# Patient Record
Sex: Female | Born: 2000 | Race: Black or African American | Hispanic: No | Marital: Single | Smoking: Former smoker
Health system: Southern US, Community
[De-identification: ages and names within clinical notes are randomized; demographics above are authoritative.]

## PROBLEM LIST (undated history)

## (undated) DIAGNOSIS — O139 Gestational [pregnancy-induced] hypertension without significant proteinuria, unspecified trimester: Secondary | ICD-10-CM

## (undated) DIAGNOSIS — Z789 Other specified health status: Secondary | ICD-10-CM

## (undated) HISTORY — PX: NO PAST SURGERIES: SHX2092

## (undated) HISTORY — DX: Gestational (pregnancy-induced) hypertension without significant proteinuria, unspecified trimester: O13.9

## (undated) HISTORY — DX: Other specified health status: Z78.9

## (undated) HISTORY — PX: OTHER SURGICAL HISTORY: SHX169

---
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
## 2022-01-04 ENCOUNTER — Emergency Department (HOSPITAL_COMMUNITY)
Admission: EM | Admit: 2022-01-04 | Discharge: 2022-01-06 | Disposition: A | Discharge status: Home / Self Care | Payer: Medicaid Other | Attending: Emergency Medicine | Admitting: Emergency Medicine

## 2022-01-04 ENCOUNTER — Emergency Department (HOSPITAL_COMMUNITY): Payer: Medicaid Other

## 2022-01-04 ENCOUNTER — Encounter (HOSPITAL_COMMUNITY): Payer: Self-pay

## 2022-01-04 DIAGNOSIS — O9A22 Injury, poisoning and certain other consequences of external causes complicating childbirth: Secondary | ICD-10-CM | POA: Insufficient documentation

## 2022-01-04 DIAGNOSIS — T50902A Poisoning by unspecified drugs, medicaments and biological substances, intentional self-harm, initial encounter: Secondary | ICD-10-CM

## 2022-01-04 DIAGNOSIS — F332 Major depressive disorder, recurrent severe without psychotic features: Secondary | ICD-10-CM | POA: Insufficient documentation

## 2022-01-04 DIAGNOSIS — N9489 Other specified conditions associated with female genital organs and menstrual cycle: Secondary | ICD-10-CM | POA: Diagnosis not present

## 2022-01-04 DIAGNOSIS — O26891 Other specified pregnancy related conditions, first trimester: Secondary | ICD-10-CM | POA: Insufficient documentation

## 2022-01-04 DIAGNOSIS — Z008 Encounter for other general examination: Secondary | ICD-10-CM

## 2022-01-04 DIAGNOSIS — F4323 Adjustment disorder with mixed anxiety and depressed mood: Secondary | ICD-10-CM | POA: Diagnosis not present

## 2022-01-04 DIAGNOSIS — Z3A08 8 weeks gestation of pregnancy: Secondary | ICD-10-CM | POA: Insufficient documentation

## 2022-01-04 DIAGNOSIS — X838XXA Intentional self-harm by other specified means, initial encounter: Secondary | ICD-10-CM | POA: Diagnosis present

## 2022-01-04 DIAGNOSIS — T483X2A Poisoning by antitussives, intentional self-harm, initial encounter: Secondary | ICD-10-CM | POA: Insufficient documentation

## 2022-01-04 LAB — COMPREHENSIVE METABOLIC PANEL
ALT: 9 U/L (ref 0–44)
AST: 17 U/L (ref 15–41)
Albumin: 3.1 g/dL — ABNORMAL LOW (ref 3.5–5.0)
Alkaline Phosphatase: 58 U/L (ref 38–126)
Anion gap: 8 (ref 5–15)
BUN: 10 mg/dL (ref 6–20)
CO2: 19 mmol/L — ABNORMAL LOW (ref 22–32)
Calcium: 8.8 mg/dL — ABNORMAL LOW (ref 8.9–10.3)
Chloride: 107 mmol/L (ref 98–111)
Creatinine, Ser: 0.59 mg/dL (ref 0.44–1.00)
GFR, Estimated: >60 mL/min (ref >60)
Glucose, Bld: 77 mg/dL (ref 70–99)
Potassium: 3.4 mmol/L — ABNORMAL LOW (ref 3.5–5.1)
Sodium: 134 mmol/L — ABNORMAL LOW (ref 135–145)
Total Bilirubin: 0.5 mg/dL (ref 0.3–1.2)
Total Protein: 5.9 g/dL — ABNORMAL LOW (ref 6.5–8.1)

## 2022-01-04 LAB — URINALYSIS, ROUTINE W REFLEX MICROSCOPIC
Bilirubin Urine: NEGATIVE
Glucose, UA: NEGATIVE mg/dL
Hgb urine dipstick: NEGATIVE
Ketones, ur: 20 mg/dL — AB
Leukocytes,Ua: NEGATIVE
Nitrite: NEGATIVE
Protein, ur: NEGATIVE mg/dL
Specific Gravity, Urine: 1.021 (ref 1.005–1.030)
pH: 6 (ref 5.0–8.0)

## 2022-01-04 LAB — I-STAT VENOUS BLOOD GAS, ED
Acid-base deficit: 4 mmol/L — ABNORMAL HIGH (ref 0.0–2.0)
Bicarbonate: 19.9 mmol/L — ABNORMAL LOW (ref 20.0–28.0)
Calcium, Ion: 1.18 mmol/L (ref 1.15–1.40)
HCT: 32 % — ABNORMAL LOW (ref 36.0–46.0)
Hemoglobin: 10.9 g/dL — ABNORMAL LOW (ref 12.0–15.0)
O2 Saturation: 91 %
Potassium: 3.4 mmol/L — ABNORMAL LOW (ref 3.5–5.1)
Sodium: 135 mmol/L (ref 135–145)
TCO2: 21 mmol/L — ABNORMAL LOW (ref 22–32)
pCO2, Ven: 33.3 mmHg — ABNORMAL LOW (ref 44–60)
pH, Ven: 7.384 (ref 7.25–7.43)
pO2, Ven: 62 mmHg — ABNORMAL HIGH (ref 32–45)

## 2022-01-04 LAB — ACETAMINOPHEN LEVEL
Acetaminophen (Tylenol), Serum: 31 ug/mL — ABNORMAL HIGH (ref 10–30)
Acetaminophen (Tylenol), Serum: 18 ug/mL (ref 10–30)

## 2022-01-04 LAB — CBC WITH DIFFERENTIAL/PLATELET
Abs Immature Granulocytes: 0.02 10*3/uL (ref 0.00–0.07)
Basophils Absolute: 0 10*3/uL (ref 0.0–0.1)
Basophils Relative: 0 %
Eosinophils Absolute: 0.2 10*3/uL (ref 0.0–0.5)
Eosinophils Relative: 2 %
HCT: 31.1 % — ABNORMAL LOW (ref 36.0–46.0)
Hemoglobin: 9.9 g/dL — ABNORMAL LOW (ref 12.0–15.0)
Immature Granulocytes: 0 %
Lymphocytes Relative: 24 %
Lymphs Abs: 2.4 10*3/uL (ref 0.7–4.0)
MCH: 21.9 pg — ABNORMAL LOW (ref 26.0–34.0)
MCHC: 31.8 g/dL (ref 30.0–36.0)
MCV: 68.8 fL — ABNORMAL LOW (ref 80.0–100.0)
Monocytes Absolute: 0.9 10*3/uL (ref 0.1–1.0)
Monocytes Relative: 9 %
Neutro Abs: 6.4 10*3/uL (ref 1.7–7.7)
Neutrophils Relative %: 65 %
Platelets: 208 10*3/uL (ref 150–400)
RBC: 4.52 MIL/uL (ref 3.87–5.11)
RDW: 16.7 % — ABNORMAL HIGH (ref 11.5–15.5)
WBC: 10 10*3/uL (ref 4.0–10.5)
nRBC: 0 % (ref 0.0–0.2)

## 2022-01-04 LAB — ETHANOL: Alcohol, Ethyl (B): <10 mg/dL (ref <10)

## 2022-01-04 LAB — HCG, QUANTITATIVE, PREGNANCY: hCG, Beta Chain, Quant, S: 177226 m[IU]/mL — ABNORMAL HIGH (ref <5)

## 2022-01-04 LAB — RAPID URINE DRUG SCREEN, HOSP PERFORMED
Amphetamines: NOT DETECTED
Barbiturates: NOT DETECTED
Benzodiazepines: NOT DETECTED
Cocaine: NOT DETECTED
Opiates: NOT DETECTED
Tetrahydrocannabinol: POSITIVE — AB

## 2022-01-04 LAB — SALICYLATE LEVEL: Salicylate Lvl: <7 mg/dL — ABNORMAL LOW (ref 7.0–30.0)

## 2022-01-04 LAB — OSMOLALITY: Osmolality: 273 mOsm/kg — ABNORMAL LOW (ref 275–295)

## 2022-01-04 MED ORDER — ONDANSETRON 4 MG PO TBDP
4.0000 mg | ORAL_TABLET | Freq: Once | ORAL | Status: AC
Start: 2022-01-04 — End: 2022-01-04
  Administered 2022-01-04: 4 mg via ORAL
  Filled 2022-01-04: qty 1

## 2022-01-04 NOTE — ED Triage Notes (Signed)
Pt is 2 months pregnant. Reports increasing depression. Took 80% of Robitussin 4oz bottle to OD. Reports abdominal pain and nausea. Alert and oriented x 4.  ?

## 2022-01-04 NOTE — ED Provider Notes (Signed)
?[name] EMERGENCY DEPARTMENT ?Provider Note ? ? ?CSN: [id number] ?Arrival date & time: 01/04/22  1054 ? ?  ? ?History ? ?Chief Complaint  ?Patient presents with  ? Drug Overdose  ? ? ?Jean Roberts is a 21 y.o. female. ? ?21 year old female presents with her mom for evaluation of potential overdose.  Patient is [redacted] weeks pregnant.  Patient states she drank most of the Robitussin bottle around 9 AM.  She is accompanied by mom who states patient has been suicidal and has been threatening to commit suicide for the past couple years.  Today was her first attempt.  Patient unable to quantify exactly what led her to come at today's episode.  She she states everything around without from going on let her to today suicide attempt.  She denies HI, or AVH.  Denies other substance abuse.  Does take prenatal vitamins otherwise denies any other daily medications. ? ?The history is provided by the patient. No language interpreter was used.  ? ?  ? ?Home Medications ?Prior to Admission medications   ?Medication Sig Start Date End Date Taking? Authorizing Provider  ?acetaminophen (TYLENOL) 500 MG tablet Take 2 tablets (1,000 mg total) by mouth every 6 (six) hours as needed for mild pain, moderate pain, fever or headache (for pain scale < 4). 04/21/21   Katrinka Blazing, [place], CNM  ?Prenatal Vit-Fe Fumarate-FA (PREPLUS) 27-1 MG TABS Take 1 tablet by mouth daily. 10/01/20   Hermina Staggers, MD  ?   ? ?Allergies    ?Patient has no known allergies.   ? ?Review of Systems   ?Review of Systems  ?Respiratory:  Negative for shortness of breath.   ?Gastrointestinal:  Positive for abdominal pain and nausea. Negative for vomiting.  ?Genitourinary:  Negative for vaginal pain.  ?Psychiatric/Behavioral:  Positive for self-injury and suicidal ideas. Negative for agitation.   ?All other systems reviewed and are negative. ? ?Physical Exam ?Updated Vital Signs ?BP 112/62 (BP Location: Left Arm)   Pulse (!) 59   Temp 98.1 ?F (36.7 ?C)  (Oral)   Resp 18   Ht 5\' 4"  (1.626 m)   Wt 53.5 kg   LMP  (LMP Unknown)   SpO2 100%   BMI 20.25 kg/m?  ?Physical Exam ?Vitals and nursing note reviewed.  ?Constitutional:   ?   General: She is not in acute distress. ?   Appearance: Normal appearance. She is not ill-appearing.  ?HENT:  ?   Head: Normocephalic and atraumatic.  ?   Nose: Nose normal.  ?Eyes:  ?   General: No scleral icterus. ?   Extraocular Movements: Extraocular movements intact.  ?   Conjunctiva/sclera: Conjunctivae normal.  ?Cardiovascular:  ?   Rate and Rhythm: Normal rate and regular rhythm.  ?   Pulses: Normal pulses.  ?Pulmonary:  ?   Effort: Pulmonary effort is normal. No respiratory distress.  ?   Breath sounds: Normal breath sounds. No wheezing or rales.  ?Abdominal:  ?   General: There is no distension.  ?   Tenderness: There is no abdominal tenderness. There is no right CVA tenderness, left CVA tenderness or guarding.  ?Musculoskeletal:     ?   General: Normal range of motion.  ?   Cervical back: Normal range of motion.  ?Skin: ?   General: Skin is warm and dry.  ?Neurological:  ?   General: No focal deficit present.  ?   Mental Status: She is alert. Mental status is at baseline.  ? ? ?  ED Results / Procedures / Treatments   ?Labs ?(all labs ordered are listed, but only abnormal results are displayed) ?Labs Reviewed  ?I-STAT VENOUS BLOOD GAS, ED - Abnormal; Notable for the following components:  ?    Result Value  ? pCO2, Ven 33.3 (*)   ? pO2, Ven 62 (*)   ? Bicarbonate 19.9 (*)   ? TCO2 21 (*)   ? Acid-base deficit 4.0 (*)   ? Potassium 3.4 (*)   ? HCT 32.0 (*)   ? Hemoglobin 10.9 (*)   ? All other components within normal limits  ?CBC WITH DIFFERENTIAL/PLATELET  ?COMPREHENSIVE METABOLIC PANEL  ?URINALYSIS, ROUTINE W REFLEX MICROSCOPIC  ?RAPID URINE DRUG SCREEN, HOSP PERFORMED  ?SALICYLATE LEVEL  ?ACETAMINOPHEN LEVEL  ?ETHANOL  ?BLOOD GAS, VENOUS  ?OSMOLALITY  ?ACETAMINOPHEN LEVEL  ? ? ?EKG ?None ? ?Radiology ?No results  found. ? ?Procedures ?Procedures  ? ? ?Medications Ordered in ED ?Medications  ?ondansetron (ZOFRAN-ODT) disintegrating tablet 4 mg (4 mg Oral Given 01/04/22 1146)  ? ? ?ED Course/ Medical Decision Making/ A&P ?  ?                        ?Medical Decision Making ?Amount and/or Complexity of Data Reviewed ?Labs: ordered. ? ?Risk ?Prescription drug management. ? ? ?21 year old female presents to the emergency room for evaluation of suicidal ideation, suicide attempt.  She drank two thirds of the Robitussin maximum strength bottle.  Currently complains of abdominal pain and nausea.  Denies vaginal bleeding, chest pain, or other complaints.  Discussed with poison control who recommended Tylenol level at 4-hour mark and a CMP.  If those were without concern patient is cleared from their standpoint.  Will evaluate with additional psych labs and place TTS consult.  Discussed with MAU APP who following the Tylenol level will have patient transition to MAU for further work-up for ectopic.  She has placed orders for ultrasound and beta-hCG. ?IVC paperwork completed.  Tylenol level at 4-hour mark is 31.  Which is less than 150 raising concern for acute toxicity.  Patient's hemoglobin is 9.9 which is around patient's baseline.  CMP shows potassium 3.4 otherwise without acute concerns.  Will be ultrasound shows intrauterine fetus otherwise without acute concerns.  Will discuss with MAU. ? ?MAU states her ultrasound is stable and she is cleared from OB standpoint.  Patient at this point awaiting TTS consult.  She has been signed out to default provider.  Mom made aware of plan.  She voices understanding. ? ?Final Clinical Impression(s) / ED Diagnoses ?Final diagnoses:  ?Intentional drug overdose, initial encounter [facility])  ?Patient needs psychiatric hold for evaluation  ? ? ?Rx / DC Orders ?ED Discharge Orders   ? ? None  ? ?  ? ? ?  ?Marita [place], PA-C ?01/04/22 1449 ? ?  ?Sloan Leiter, DO ?01/05/22 1919 ? ?  ?Sloan Leiter,  DO ?01/05/22 1922 ? ?
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
## 2022-01-04 NOTE — ED Notes (Signed)
MH eval began with monitor. ?

## 2022-01-04 NOTE — ED Notes (Signed)
Report received assumed care, pt. Was brought from hallway 21 on stretcher  with sitter at this time. Pt. Dressed in Systems analyst. Pt. Slightly drowsy and feels dizzy. Security will wand patient. ?

## 2022-01-04 NOTE — BH Assessment (Signed)
@   12:35 Labs pending - patient is not medically cleared at this time.  ?

## 2022-01-05 NOTE — BH Assessment (Signed)
RE-ASSESSMENT ? ?Pt is an 21 year old single female who ingested a bottle of Robitussin in suicide attempt yesterday. Inpatient psychiatric treatment was recommended and Pt was open to recommendation. Today, Pt says she is feeling better and would like to be discharged home, stating there are people in the ED who need the bed more than her. She says yesterday she "had a little mental breakdown" felt overwhelmed and was crying. She says she felt no one was listening to her and that she was a bad mother because she had lost her [facility] card and her baby was out of formula. She says this was her first suicide attempt. She denies current suicidal ideation, homicidal ideation, or psychotic symptoms. She says she acknowledges that her children's father does not want a relationship and is does not want to be with someone who does not want to be with her. She says she does not have an outpatient mental health provider but she would like to participate in counseling. ? ?Pt says she lives with her mother and her siblings. She gave permission to speak with her mother, Inda Merlin [phone]. Ms Joelene Millin says she does not feel Pt would harm herself if she came home but she would like Pt to receive treatment as soon as possible. She says she is comfortable with discharge or with inpatient psychiatric treatment. ? ?Pt was petitioned for involuntary commitment by Tanda Rockers, DO on 01/04/2022. ? ?Discussed case with Roselyn Bering, NP who recommends inpatient psychiatric treatment. Notified Dr. Coralee Pesa and Valetta Close, RN of recommendation via secure message. ? ? ?Harlin Rain Patsy [place], [facility], NCC ?Triage Specialist ?[phone] ? ?

## 2022-01-05 NOTE — ED Notes (Signed)
Breakfast Orders Placed °

## 2022-01-05 NOTE — ED Notes (Signed)
IVC paperwork found in patients chart by Dr. Wallace Cullens on 4/29. IVC order placed and paperwork faxed to [facility].  ?

## 2022-01-05 NOTE — ED Notes (Signed)
Pt requesting to speak with TTS team again. Spoke with TTS and they states she is on the list and will be contacted later in the night. Pt informed.  ?

## 2022-01-05 NOTE — Progress Notes (Signed)
Per Tilford Pillar, patient meets criteria for inpatient treatment. There are no available at [facility] today, per [facility]. CSW faxed referrals to the following facilities for review: ? ?[facility]  Pending - Request Sent N/A [address], [place] [address] [phone] --  ?[facility]  Pending - Request Sent N/A [address], [place] [address] [phone] --  ?[facility]  Pending - Request Sent N/A [address] [place] [address] [phone] --  ?[facility]  Pending - Request Sent N/A [address] [place], [place] [address] [phone] --  ?[facility]  Pending - Request Sent N/A [address], [place] [address] [phone] --  ? ?TTS will continue to seek bed placement. ? ?Crissie Reese, MSW, LCSW-A, LCAS-A ?Phone: [phone] ?Disposition/TOC ? ?

## 2022-01-05 NOTE — ED Notes (Signed)
Pt. Had dinner, pt is calm and cooperative  ?

## 2022-01-05 NOTE — ED Notes (Signed)
Pt sleeping at this time. Will obtain vitals when pt wakes up.  

## 2022-01-05 NOTE — ED Provider Notes (Signed)
Emergency Medicine Observation Re-evaluation Note ? ?Jean Roberts is a 21 y.o. female, seen on rounds today.  Pt initially presented to the ED for complaints of Drug Overdose ?Currently, the patient is sitting in room. ? ?Physical Exam  ?BP (!) 113/47 (BP Location: Right Arm)   Pulse 66   Temp 98.3 ?F (36.8 ?C) (Oral)   Resp 18   Ht 5\' 4"  (1.626 m)   Wt 53.5 kg   LMP  (LMP Unknown)   SpO2 100%   BMI 20.25 kg/m?  ?Physical Exam ?General: resting comfortably, NAD ?Lungs: normal WOB ?Psych: currently calm and resting ? ?ED Course / MDM  ?EKG:  ? ?I have reviewed the labs performed to date as well as medications administered while in observation.  Recent changes in the last 24 hours include none . ? ?Plan  ?Current plan is for inpatient psychiatric care, pending placement. ? Jean Roberts is not under involuntary commitment. ? ? ?  ?[name], DO ?01/05/22 1130 ? ?

## 2022-01-05 NOTE — ED Notes (Addendum)
Pt requested to speak to psych again, after speaking, Quintella Reichert, NP continues to recommend inpatient psychiatric treatment. ?

## 2022-01-06 ENCOUNTER — Telehealth: Payer: Self-pay | Admitting: Family Medicine

## 2022-01-06 ENCOUNTER — Encounter (HOSPITAL_COMMUNITY): Payer: Self-pay | Admitting: Registered Nurse

## 2022-01-06 DIAGNOSIS — Z008 Encounter for other general examination: Secondary | ICD-10-CM

## 2022-01-06 DIAGNOSIS — T50902A Poisoning by unspecified drugs, medicaments and biological substances, intentional self-harm, initial encounter: Secondary | ICD-10-CM

## 2022-01-06 DIAGNOSIS — F4323 Adjustment disorder with mixed anxiety and depressed mood: Secondary | ICD-10-CM

## 2022-01-06 DIAGNOSIS — X838XXA Intentional self-harm by other specified means, initial encounter: Secondary | ICD-10-CM | POA: Diagnosis present

## 2022-01-06 NOTE — ED Notes (Signed)
Breakfast Order Placed ?

## 2022-01-06 NOTE — ED Notes (Signed)
Pt is resting to left side with eyes closed. NAD noted, equal and symmetrical RR noted. Care plan continued.  ?

## 2022-01-06 NOTE — ED Notes (Signed)
Pt is resting to left side with eyes closed. NAD noted, equal and symmetrical RR noted. Care plan continued.  ?

## 2022-01-06 NOTE — BH Assessment (Addendum)
[facility] Assessment Progress Note ? ?Per Shuvon Rankin, NP, this pt does not require psychiatric hospitalization at this time.  Pt is psychiatrically cleared.  Shuvon has recommended pt for the Partial Hospitalization Program, and Moses Manners, LCSW-A has scheduled pt for a virtual intake appointment  tomorrow, Tuesday, Jan 07, 2022 at 12:30, which this Probation officer has included in pt's discharge instructions.  Pt's nurses, Marta Antu and [facility], have been notified. ? ?Jean Mullet, MA ?Triage Specialist ?[phone] ? ?

## 2022-01-06 NOTE — ED Notes (Signed)
Pt sleeping at this time. Will obtain vitals when she wakes up.  ?

## 2022-01-06 NOTE — Discharge Instructions (Addendum)
For your behavioral health needs you are advised to follow up with the Partial Hospitalization Program (PHP) offered through [facility].  You are scheduled for an assessment for the PHP on Tuesday, Jan 07, 2022 at 12:30 pm. This appointment will last approximately one hour and will be virtual via Webex. PHP is virtual group therapy that runs Monday - Friday from 9:00 am-1:00 pm. Please download the [employer] app prior to the appointment. If you need to cancel or reschedule, please call [phone]. ?     [facility] ?     [address] ?     [place], [place] [address] ?     Contact: Ardith Dark, LCSW-A ?     [phone]  ? ? ?Safety Plan ?Jean Roberts will reach out to her mother, grandmother, call 911 or call mobile crisis, or go to nearest emergency room if condition worsens or if suicidal thoughts become active ?Patients' will follow up with resources/referral for outpatient psychiatric services (therapy/medication management).  PHP or IOP program ?The suicide prevention education provided includes the following: ?Suicide risk factors ?Suicide prevention and interventions ?National Suicide Hotline telephone number ?[facility] assessment telephone number ?[facility] Emergency Assistance 911 ?[place] and/or Residential Mobile Crisis Unit telephone number ?Request made of family/significant other to: Patient and her mother ?Remove weapons (e.g., guns, rifles, knives), all items previously/currently identified as safety concern.   ?Remove drugs/medications (over the counter, prescriptions, illicit drugs), all items previously/currently identified as a safety concern.   ?

## 2022-01-06 NOTE — Telephone Encounter (Signed)
Called patient to schedule new ob appointments, there was no answer to the phone call and the option to leave a voicemail was unavailable. A mychart message was sent to the patient.  ?

## 2022-01-06 NOTE — Consult Note (Signed)
Telepsych Consultation  ? ?Reason for Consult:  Suicide attempt ?Referring Physician:  Marita Kansas, PA-C ?Location of Patient: [facility] ED ?Location of Provider: Other: [facility] ? ?Patient Identification: Jean Roberts ?MRN:  [id number] ?Principal Diagnosis: Adjustment disorder with mixed anxiety and depressed mood ?Diagnosis:  Principal Problem: ?  Adjustment disorder with mixed anxiety and depressed mood ?Active Problems: ?  Unsuccessful suicide attempt [facility]) ? ? ?Total Time spent with patient: 45 minutes ? ?Subjective:   ?Jean Roberts is a 21 y.o. female patient admitted to [facility] ED after presenting voluntarily with complaints of potentially overdosing on Robitussin. ? ?HPI:  Jean Roberts, 21 y.o., female patient seen via tele health by this provider, consulted with Dr. Nelly Rout; and chart reviewed on 01/06/22.  On evaluation Jean Roberts reports she was brought to the hospital because she tried to kill herself.  Patient reports that she was feeling overwhelmed related to having a lot going on at that time.  She reports she feels a lot better today and that she is no longer having any suicidal thoughts and regrets the actions she took.  At this time patient denies suicidal/self-harm/homicidal ideation, psychosis, and paranoia.  Patient reports she has been eating and sleeping without any difficulty.  Patient denies prior psychiatric history.  Reports she lives at home with her mother and 2 younger sisters.  Reports her family is supportive and they are getting along.  Patient reports she has plans to graduate high school this year and to attend college after graduation.  Patient gave permission to speak to her mother for collateral information. ?During evaluation Jean Roberts is sitting up on bed with no noted distress.  She is alert/oriented x 4; calm/cooperative; and mood congruent with affect.  She is speaking in a clear tone at moderate volume, and normal pace; with good eye contact.  Her thought process is  coherent and relevant; There is no indication that she is currently responding to internal/external stimuli or experiencing delusional thought content; and she denies suicidal/self-harm/homicidal ideation, psychosis, and paranoia.  Patient has remained calm throughout assessment and has answered questions appropriately.   ? ? ?Collateral Information:  Spoke to patients mother Inda Merlin at [phone].  Mother reports that she has spoken to patient and feels that patient will be safe if discharged home.  States that she has no more concerns for her safety but would like for to be set up in a outpatient program (services).  States there is some depression and wants patient and baby to have services needed to continue being safe.  States patient is to graduate from high school June 9th.  States that patient will be able to do in person or virtual services.  ? ?Past Psychiatric History: Reports feelings of depression, no prior diagnosis.  No prior psychiatric hospitalization, or outpatient psychiatric services.  Denies psychotropic medications, prior suicide attempt, or self harming behaviors.   ? ?Risk to Self:  Denies ?Risk to Others:  Denies ?Prior Inpatient Therapy:  No ?Prior Outpatient Therapy:  No ? ?Past Medical History:  ?Past Medical History:  ?Diagnosis Date  ? Medical history non-contributory   ?  ?Past Surgical History:  ?Procedure Laterality Date  ? NO PAST SURGERIES    ? ?Family History:  ?Family History  ?Problem Relation Age of Onset  ? Healthy Mother   ? Healthy Father   ? ?Family Psychiatric  History: None reported ?Social History:  ?Social History  ? ?Substance and Sexual Activity  ?Alcohol Use Never  ?   ?  Social History  ? ?Substance and Sexual Activity  ?Drug Use Never  ?  ?Social History  ? ?Socioeconomic History  ? Marital status: Single  ?  Spouse name: Not on file  ? Number of children: Not on file  ? Years of education: Not on file  ? Highest education level: Not on file  ?Occupational  History  ? Not on file  ?Tobacco Use  ? Smoking status: Every Day  ? Smokeless tobacco: Current  ?Vaping Use  ? Vaping Use: Every day  ?Substance and Sexual Activity  ? Alcohol use: Never  ? Drug use: Never  ? Sexual activity: Yes  ?  Birth control/protection: Pill  ?Other Topics Concern  ? Not on file  ?Social History Narrative  ? Not on file  ? ?Social Determinants of Health  ? ?Financial Resource Strain: Not on file  ?Food Insecurity: No Food Insecurity  ? Worried About Programme researcher, broadcasting/film/videounning Out of Food in the Last Year: Never true  ? Ran Out of Food in the Last Year: Never true  ?Transportation Needs: Unmet Transportation Needs  ? Lack of Transportation (Medical): Yes  ? Lack of Transportation (Non-Medical): Yes  ?Physical Activity: Not on file  ?Stress: Not on file  ?Social Connections: Not on file  ? ?Additional Social History: ?  ? ?Allergies:  No Known Allergies ? ?Labs:  ?Results for orders placed or performed during the hospital encounter of 01/04/22 (from the past 48 hour(s))  ?Salicylate level     Status: Abnormal  ? Collection Time: 01/04/22 11:49 AM  ?Result Value Ref Range  ? Salicylate Lvl <7.0 (L) 7.0 - 30.0 mg/dL  ?  Comment: Performed at [facility], [address] [place], [place] [address]  ?Acetaminophen level     Status: Abnormal  ? Collection Time: 01/04/22 11:49 AM  ?Result Value Ref Range  ? Acetaminophen (Tylenol), Serum 31 (H) 10 - 30 ug/mL  ?  Comment: (NOTE) ?Therapeutic concentrations vary significantly. A range of 10-30 ug/mL  ?may be an effective concentration for many patients. However, some  ?are best treated at concentrations outside of this range. ?Acetaminophen concentrations >150 ug/mL at 4 hours after ingestion  ?and >50 ug/mL at 12 hours after ingestion are often associated with  ?toxic reactions. ? ?Performed at [facility], [address] [place], [place] [address] ?  ?Ethanol     Status: None  ? Collection Time: 01/04/22 11:49 AM  ?Result Value Ref Range  ? Alcohol,  Ethyl (B) <10 <10 mg/dL  ?  Comment: (NOTE) ?Lowest detectable limit for serum alcohol is 10 mg/dL. ? ?For medical purposes only. ?Performed at [facility], [address] [place], [place] ?[address] ?  ?CBC with Differential     Status: Abnormal  ? Collection Time: 01/04/22 11:50 AM  ?Result Value Ref Range  ? WBC 10.0 4.0 - 10.5 K/uL  ? RBC 4.52 3.87 - 5.11 MIL/uL  ? Hemoglobin 9.9 (L) 12.0 - 15.0 g/dL  ? HCT 31.1 (L) 36.0 - 46.0 %  ? MCV 68.8 (L) 80.0 - 100.0 fL  ? MCH 21.9 (L) 26.0 - 34.0 pg  ? MCHC 31.8 30.0 - 36.0 g/dL  ? RDW 16.7 (H) 11.5 - 15.5 %  ? Platelets 208 150 - 400 K/uL  ?  Comment: REPEATED TO VERIFY  ? nRBC 0.0 0.0 - 0.2 %  ? Neutrophils Relative % 65 %  ? Neutro Abs 6.4 1.7 - 7.7 K/uL  ? Lymphocytes  Relative 24 %  ? Lymphs Abs 2.4 0.7 - 4.0 K/uL  ? Monocytes Relative 9 %  ? Monocytes Absolute 0.9 0.1 - 1.0 K/uL  ? Eosinophils Relative 2 %  ? Eosinophils Absolute 0.2 0.0 - 0.5 K/uL  ? Basophils Relative 0 %  ? Basophils Absolute 0.0 0.0 - 0.1 K/uL  ? Immature Granulocytes 0 %  ? Abs Immature Granulocytes 0.02 0.00 - 0.07 K/uL  ?  Comment: Performed at [facility], [address] [place], [place] [address]  ?Comprehensive metabolic panel     Status: Abnormal  ? Collection Time: 01/04/22 11:50 AM  ?Result Value Ref Range  ? Sodium 134 (L) 135 - 145 mmol/L  ? Potassium 3.4 (L) 3.5 - 5.1 mmol/L  ? Chloride 107 98 - 111 mmol/L  ? CO2 19 (L) 22 - 32 mmol/L  ? Glucose, Bld 77 70 - 99 mg/dL  ?  Comment: Glucose reference range applies only to samples taken after fasting for at least 8 hours.  ? BUN 10 6 - 20 mg/dL  ? Creatinine, Ser 0.59 0.44 - 1.00 mg/dL  ? Calcium 8.8 (L) 8.9 - 10.3 mg/dL  ? Total Protein 5.9 (L) 6.5 - 8.1 g/dL  ? Albumin 3.1 (L) 3.5 - 5.0 g/dL  ? AST 17 15 - 41 U/L  ? ALT 9 0 - 44 U/L  ? Alkaline Phosphatase 58 38 - 126 U/L  ? Total Bilirubin 0.5 0.3 - 1.2 mg/dL  ? GFR, Estimated >60 >60 mL/min  ?  Comment: (NOTE) ?Calculated using the CKD-EPI Creatinine Equation  (2021) ?  ? Anion gap 8 5 - 15  ?  Comment: Performed at [facility], [address] [place], [place] [address]  ?Osmolality     Status: Abnormal  ? Collection Time: 01/04/22 11:50 AM  ?Result Value Ref Range

## 2022-01-07 ENCOUNTER — Ambulatory Visit (HOSPITAL_COMMUNITY): Payer: Medicaid Other

## 2022-01-07 ENCOUNTER — Telehealth (HOSPITAL_COMMUNITY): Payer: Self-pay | Admitting: Licensed Clinical Social Worker

## 2022-01-07 ENCOUNTER — Telehealth: Payer: Self-pay | Admitting: Family Medicine

## 2022-01-07 NOTE — Telephone Encounter (Signed)
Cln began Webex meeting at 12:30 and remained connected until 12:45 pm. Pt failed to sign on. ?

## 2022-01-07 NOTE — Telephone Encounter (Signed)
Called patient to schedule New OB appointments, there was no answer to the phone call and the option to leave a voicemail was unavailable.  ?

## 2022-01-21 ENCOUNTER — Encounter: Payer: Self-pay | Admitting: Obstetrics & Gynecology

## 2022-01-22 ENCOUNTER — Telehealth (INDEPENDENT_AMBULATORY_CARE_PROVIDER_SITE_OTHER): Payer: Medicaid Other

## 2022-01-22 DIAGNOSIS — O09299 Supervision of pregnancy with other poor reproductive or obstetric history, unspecified trimester: Secondary | ICD-10-CM

## 2022-01-22 DIAGNOSIS — Z3A00 Weeks of gestation of pregnancy not specified: Secondary | ICD-10-CM

## 2022-01-22 DIAGNOSIS — O0990 Supervision of high risk pregnancy, unspecified, unspecified trimester: Secondary | ICD-10-CM | POA: Insufficient documentation

## 2022-01-22 HISTORY — DX: Supervision of high risk pregnancy, unspecified, unspecified trimester: O09.90

## 2022-01-22 MED ORDER — GOJJI WEIGHT SCALE MISC: 1.0000 | 0 refills | Status: DC | PRN

## 2022-01-22 NOTE — Progress Notes (Signed)
New OB Intake  I connected with  Jean Roberts on 01/22/22 at  9:15 AM EDT by MyChart Video Visit and verified that I am speaking with the correct person using two identifiers. Nurse is located at [facility] and pt is located at home.  I discussed the limitations, risks, security and privacy concerns of performing an evaluation and management service by telephone and the availability of in person appointments. I also discussed with the patient that there may be a patient responsible charge related to this service. The patient expressed understanding and agreed to proceed.  I explained I am completing New OB Intake today. We discussed her EDD of 08/11/2022 that is based on ultrasound on 12/10/2021 Pt is G2/P1. I reviewed her allergies, medications, Medical/Surgical/OB history, and appropriate screenings. I informed her of [facility] services. Based on history, this is an  pregnancy is complicated by history of gestational hypertension:  Patient Active Problem List   Diagnosis Date Noted   Supervision of high risk pregnancy, antepartum 01/22/2022   Adjustment disorder with mixed anxiety and depressed mood 01/06/2022   Unsuccessful suicide attempt (HCC) 01/06/2022   Intentional drug overdose (HCC)    Patient needs psychiatric hold for evaluation    Gestational hypertension 04/18/2021    Concerns addressed today: -Patient is not required to get a pap smear unitl age 76. -Anatomy ultrasound scheduled.  -History of gestational hypertension.   Delivery Plans:  Plans [facility].   MyChart/Babyscripts MyChart access verified. I explained pt will have some visits in office and some virtually. Babyscripts instructions given and order placed. Patient verifies receipt of registration text/e-mail. Account successfully created and app downloaded.  Blood Pressure Cuff  Patient still has her blood pressure cuff from her last pregnancy.  Weight scale: Patient does not  have weight scale. Weight  scale ordered for patient to pick up from [employer].   Anatomy US Explained first scheduled [place] will be around 19 weeks. Anatomy US scheduled for 03/17/2022 at 10:30AM. Pt notified to arrive at 10:15AM.  Labs Discussed Natera genetic screening with patient. Would like both Panorama and Horizon drawn at new OB visit.Also if interested in genetic testing, tell patient she will need AFP 15-21 weeks to complete genetic testing .Routine prenatal labs needed.  Covid Vaccine Patient has not covid vaccine.   Is patient a CenteringPregnancy candidate?  Declined Declined due to Childcare  Is patient a Mom+Baby Combined Care candidate?  Not a candidate    Is patient interested in [place]?  No  Informed patient of [facility] website  and placed link in her AVS.   Social Determinants of Health Food Insecurity: Patient denies food insecurity. WIC Referral: Patient is interested in referral to [facility].  Transportation: Patient denies transportation needs. Childcare: Discussed no children allowed at ultrasound appointments. Offered childcare services; patient declines childcare services at this time.  Send link to Pregnancy Navigators   Placed OB Box on problem list and updated  First visit review I reviewed new OB appt with pt. I explained she will have a pelvic exam, ob bloodwork with genetic screening. Explained pt will be seen by Scheryl Darter MD at first visit; encounter routed to appropriate provider. Explained that patient will be seen by pregnancy navigator following visit with provider. [facility] information placed in AVS.   Vidal Schwalbe, CMA 01/22/2022  9:36 AM

## 2022-02-24 ENCOUNTER — Other Ambulatory Visit (HOSPITAL_COMMUNITY)
Admission: RE | Admit: 2022-02-24 | Discharge: 2022-02-24 | Disposition: A | Discharge status: Home / Self Care | Payer: Medicaid Other | Source: Ambulatory Visit | Attending: Obstetrics & Gynecology | Admitting: Obstetrics & Gynecology

## 2022-02-24 ENCOUNTER — Encounter: Payer: Self-pay | Admitting: Obstetrics & Gynecology

## 2022-02-24 ENCOUNTER — Ambulatory Visit (INDEPENDENT_AMBULATORY_CARE_PROVIDER_SITE_OTHER): Payer: Medicaid Other | Admitting: Obstetrics & Gynecology

## 2022-02-24 VITALS — BP 92/66 | HR 70 | Wt 124.6 lb

## 2022-02-24 DIAGNOSIS — O0990 Supervision of high risk pregnancy, unspecified, unspecified trimester: Secondary | ICD-10-CM | POA: Diagnosis present

## 2022-02-24 DIAGNOSIS — F4323 Adjustment disorder with mixed anxiety and depressed mood: Secondary | ICD-10-CM | POA: Diagnosis not present

## 2022-02-24 DIAGNOSIS — Z3A16 16 weeks gestation of pregnancy: Secondary | ICD-10-CM | POA: Diagnosis not present

## 2022-02-24 MED ORDER — ASPIRIN 81 MG PO TBEC: 81.0000 mg | DELAYED_RELEASE_TABLET | Freq: Every day | ORAL | 2 refills | Status: DC

## 2022-02-24 MED ORDER — BLOOD PRESSURE KIT DEVI: 1.0000 | 0 refills | Status: DC | PRN

## 2022-02-24 NOTE — Progress Notes (Signed)
  Subjective:    Jean Roberts is a G2P1001 [redacted]w[redacted]d being seen today for her first obstetrical visit.  Her obstetrical history is significant for  recent delivery, teen pregnancy, h/o GHTN . Patient does intend to breast feed. Pregnancy history fully reviewed.  Patient reports no complaints.  Vitals:   02/24/22 0828  BP: 92/66  Pulse: 70  Weight: 124 lb 9.6 oz (56.5 kg)    HISTORY: OB History  Gravida Para Term Preterm AB Living  2 1 1     1   SAB IAB Ectopic Multiple Live Births        0 1    # Outcome Date GA Lbr Len/2nd Weight Sex Delivery Anes PTL Lv  2 Current           1 Term 04/19/21 [redacted]w[redacted]d 01:50 / 00:08 5 lb 14.9 oz (2.69 kg) M Vag-Spont EPI  LIV   Past Medical History:  Diagnosis Date   Gestational hypertension    Medical history non-contributory    Past Surgical History:  Procedure Laterality Date   gestational hypertension     NO PAST SURGERIES     Family History  Problem Relation Age of Onset   Healthy Mother    Healthy Father      Exam    Uterus:   16 weeks  Pelvic Exam:    Perineum:    Vulva:    Vagina:     pH:    Cervix:    Adnexa: not evaluated   Bony Pelvis:   System: Breast:  deferred   Skin: normal coloration and turgor, no rashes    Neurologic: normal, normal mood   Extremities: normal strength, tone, and muscle mass   HEENT PERRLA and neck supple with midline trachea   Mouth/Teeth mucous membranes moist, pharynx normal without lesions and dental hygiene good   Neck supple   Cardiovascular: regular rate and rhythm, no murmurs or gallops   Respiratory:  appears well, vitals normal, no respiratory distress, acyanotic, normal RR, neck free of mass or lymphadenopathy, chest clear, no wheezing, crepitations, rhonchi, normal symmetric air entry   Abdomen: soft, non-tender; bowel sounds normal; no masses,  no organomegaly   Urinary:       Assessment:    Pregnancy: G2P1001 Patient Active Problem List   Diagnosis Date Noted   Supervision  of high risk pregnancy, antepartum 01/22/2022   Adjustment disorder with mixed anxiety and depressed mood 01/06/2022   Unsuccessful suicide attempt (HCC) 01/06/2022   Intentional drug overdose (HCC)    Patient needs psychiatric hold for evaluation    Gestational hypertension 04/18/2021        Plan:     Initial labs drawn. Prenatal vitamins. Problem list reviewed and updated. Genetic Screening discussed : ordered.  Ultrasound discussed; fetal survey: ordered.  Follow up in 4 weeks. 50% of 30 min visit spent on counseling and coordination of care.  Needs BH f/u. ASA 81 mg/day   06/18/2021 02/24/2022

## 2022-02-25 LAB — GC/CHLAMYDIA PROBE AMP (~~LOC~~) NOT AT ARMC
Chlamydia: NEGATIVE
Comment: NEGATIVE
Comment: NORMAL
Neisseria Gonorrhea: NEGATIVE

## 2022-02-26 LAB — CBC/D/PLT+RPR+RH+ABO+RUBIGG...
Antibody Screen: NEGATIVE
Basophils Absolute: 0 10*3/uL (ref 0.0–0.2)
Basos: 0 %
EOS (ABSOLUTE): 0.2 10*3/uL (ref 0.0–0.4)
Eos: 2 %
HCV Ab: NONREACTIVE
HIV Screen 4th Generation wRfx: NONREACTIVE
Hematocrit: 35 % (ref 34.0–46.6)
Hemoglobin: 11 g/dL — ABNORMAL LOW (ref 11.1–15.9)
Hepatitis B Surface Ag: NEGATIVE
Immature Grans (Abs): 0 10*3/uL (ref 0.0–0.1)
Immature Granulocytes: 0 %
Lymphocytes Absolute: 3.2 10*3/uL — ABNORMAL HIGH (ref 0.7–3.1)
Lymphs: 35 %
MCH: 22.7 pg — ABNORMAL LOW (ref 26.6–33.0)
MCHC: 31.4 g/dL — ABNORMAL LOW (ref 31.5–35.7)
MCV: 72 fL — ABNORMAL LOW (ref 79–97)
Monocytes Absolute: 0.4 10*3/uL (ref 0.1–0.9)
Monocytes: 5 %
Neutrophils Absolute: 5.2 10*3/uL (ref 1.4–7.0)
Neutrophils: 58 %
Platelets: 280 10*3/uL (ref 150–450)
RBC: 4.85 x10E6/uL (ref 3.77–5.28)
RDW: 16.8 % — ABNORMAL HIGH (ref 11.7–15.4)
RPR Ser Ql: NONREACTIVE
Rh Factor: POSITIVE
Rubella Antibodies, IGG: 1.52 index (ref >0.99)
WBC: 9.1 10*3/uL (ref 3.4–10.8)

## 2022-02-26 LAB — PROTEIN / CREATININE RATIO, URINE
Creatinine, Urine: 139.4 mg/dL
Protein, Ur: 12.4 mg/dL
Protein/Creat Ratio: 89 mg/g creat (ref 0–200)

## 2022-02-26 LAB — AFP, SERUM, OPEN SPINA BIFIDA
AFP MoM: 0.95
AFP Value: 40.6 ng/mL
Gest. Age on Collection Date: 16 weeks
Maternal Age At EDD: 19.1 yr
OSBR Risk 1 IN: 10000
Test Results:: NEGATIVE
Weight: 125 [lb_av]

## 2022-02-26 LAB — COMPREHENSIVE METABOLIC PANEL
ALT: 9 IU/L (ref 0–32)
AST: 11 IU/L (ref 0–40)
Albumin/Globulin Ratio: 1.3 (ref 1.2–2.2)
Albumin: 3.8 g/dL — ABNORMAL LOW (ref 3.9–5.0)
Alkaline Phosphatase: 78 IU/L (ref 42–106)
BUN/Creatinine Ratio: 20 (ref 9–23)
BUN: 12 mg/dL (ref 6–20)
Bilirubin Total: <0.2 mg/dL (ref 0.0–1.2)
CO2: 19 mmol/L — ABNORMAL LOW (ref 20–29)
Calcium: 9 mg/dL (ref 8.7–10.2)
Chloride: 102 mmol/L (ref 96–106)
Creatinine, Ser: 0.61 mg/dL (ref 0.57–1.00)
Globulin, Total: 2.9 g/dL (ref 1.5–4.5)
Glucose: 62 mg/dL — ABNORMAL LOW (ref 70–99)
Potassium: 3.3 mmol/L — ABNORMAL LOW (ref 3.5–5.2)
Sodium: 137 mmol/L (ref 134–144)
Total Protein: 6.7 g/dL (ref 6.0–8.5)
eGFR: 133 mL/min/{1.73_m2} (ref >59)

## 2022-02-26 LAB — HEMOGLOBIN A1C
Est. average glucose Bld gHb Est-mCnc: 103 mg/dL
Hgb A1c MFr Bld: 5.2 % (ref 4.8–5.6)

## 2022-02-26 LAB — URINE CULTURE, OB REFLEX

## 2022-02-26 LAB — HCV INTERPRETATION

## 2022-02-26 LAB — CULTURE, OB URINE

## 2022-03-10 ENCOUNTER — Telehealth: Payer: Self-pay

## 2022-03-10 ENCOUNTER — Encounter: Payer: Self-pay | Admitting: Obstetrics & Gynecology

## 2022-03-10 NOTE — Telephone Encounter (Signed)
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 Called pt; VM left stating I will send message to MyChart with results.

## 2022-03-14 NOTE — Progress Notes (Signed)
Spoke to patient on the phone to inform her panorama genetic screen result will be upload in her MyChart. If she do not want to know the baby's gender do not click on it.  Patient understood.  Jean Roberts [facility] 03/14/2022

## 2022-03-17 ENCOUNTER — Encounter: Payer: Self-pay | Admitting: *Deleted

## 2022-03-17 ENCOUNTER — Other Ambulatory Visit: Payer: Self-pay | Admitting: *Deleted

## 2022-03-17 ENCOUNTER — Ambulatory Visit: Payer: Medicaid Other | Attending: Obstetrics & Gynecology

## 2022-03-17 ENCOUNTER — Ambulatory Visit: Payer: Medicaid Other | Admitting: *Deleted

## 2022-03-17 VITALS — BP 114/59 | HR 66

## 2022-03-17 DIAGNOSIS — Z8759 Personal history of other complications of pregnancy, childbirth and the puerperium: Secondary | ICD-10-CM

## 2022-03-17 DIAGNOSIS — O0990 Supervision of high risk pregnancy, unspecified, unspecified trimester: Secondary | ICD-10-CM | POA: Insufficient documentation

## 2022-03-17 DIAGNOSIS — F1991 Other psychoactive substance use, unspecified, in remission: Secondary | ICD-10-CM

## 2022-03-17 DIAGNOSIS — O09892 Supervision of other high risk pregnancies, second trimester: Secondary | ICD-10-CM

## 2022-03-20 ENCOUNTER — Encounter: Payer: Self-pay | Admitting: Obstetrics and Gynecology

## 2022-03-24 ENCOUNTER — Other Ambulatory Visit: Payer: Self-pay

## 2022-03-24 ENCOUNTER — Ambulatory Visit (INDEPENDENT_AMBULATORY_CARE_PROVIDER_SITE_OTHER): Payer: Medicaid Other | Admitting: Obstetrics & Gynecology

## 2022-03-24 VITALS — BP 116/54 | HR 78 | Wt 133.4 lb

## 2022-03-24 DIAGNOSIS — O26899 Other specified pregnancy related conditions, unspecified trimester: Secondary | ICD-10-CM

## 2022-03-24 DIAGNOSIS — O0990 Supervision of high risk pregnancy, unspecified, unspecified trimester: Secondary | ICD-10-CM

## 2022-03-24 DIAGNOSIS — R519 Headache, unspecified: Secondary | ICD-10-CM

## 2022-03-24 NOTE — Progress Notes (Signed)
Patient reports having "bad" headaches when she wakes up for the past 2 weeks. She also stated that she occasionally has vision disturbances but "only when I don't eat, my eyes go black"  Jean Roberts informed me that she did have blood pressure issues in her previous pregnancy

## 2022-03-24 NOTE — Progress Notes (Signed)
   PRENATAL VISIT NOTE  Subjective:  Jean Roberts is a 21 y.o. G2P1001 at [redacted]w[redacted]d being seen today for ongoing prenatal care.  She is currently monitored for the following issues for this high-risk pregnancy and has Adjustment disorder with mixed anxiety and depressed mood; Unsuccessful suicide attempt [facility]); Intentional drug overdose (HCC); Patient needs psychiatric hold for evaluation; and Supervision of high risk pregnancy, antepartum on their problem list.  Patient reports headache.  Contractions: Not present. Vag. Bleeding: None.  Movement: Present. Denies leaking of fluid.   The following portions of the patient's history were reviewed and updated as appropriate: allergies, current medications, past family history, past medical history, past social history, past surgical history and problem list.   Objective:   Vitals:   03/24/22 1637  BP: (!) 116/54  Pulse: 78  Weight: 133 lb 6.4 oz (60.5 kg)    Fetal Status: Fetal Heart Rate (bpm): 154   Movement: Present    [place], AT, no head tenderness General:  Alert, oriented and cooperative. Patient is in no acute distress.  Skin: Skin is warm and dry. No rash noted.   Cardiovascular: Normal heart rate noted  Respiratory: Normal respiratory effort, no problems with respiration noted  Abdomen: Soft, gravid, appropriate for gestational age.  Pain/Pressure: Absent     Pelvic: Cervical exam deferred        Extremities: Normal range of motion.  Edema: None  Mental Status: Normal mood and affect. Normal behavior. Normal judgment and thought content.   Assessment and Plan:  Pregnancy: G2P1001 at [redacted]w[redacted]d 1. Supervision of high risk pregnancy, antepartum Headaches for 2 weeks  2. Headache in pregnancy, antepartum Refer to Nada Maclachlan  Preterm labor symptoms and general obstetric precautions including but not limited to vaginal bleeding, contractions, leaking of fluid and fetal movement were reviewed in detail with the patient. Please refer  to After Visit Summary for other counseling recommendations.   Return in about 4 weeks (around 04/21/2022).  Future Appointments  Date Time Provider Department Center  03/25/2022  2:00 PM [facility] GENETIC RM [facility] None  04/21/2022  2:45 PM [facility] NURSE [facility]  04/21/2022  3:00 PM [facility] US1 [facility]    Scheryl Darter, MD

## 2022-03-25 ENCOUNTER — Telehealth: Payer: Self-pay

## 2022-03-25 ENCOUNTER — Ambulatory Visit: Payer: Medicaid Other | Attending: Obstetrics and Gynecology

## 2022-03-25 NOTE — Telephone Encounter (Signed)
Called patient to move her GC to 03/26/22 - in person [facility] appointment - not available

## 2022-04-21 ENCOUNTER — Encounter: Payer: Self-pay | Admitting: *Deleted

## 2022-04-21 ENCOUNTER — Ambulatory Visit: Payer: Medicaid Other | Attending: Obstetrics and Gynecology

## 2022-04-21 ENCOUNTER — Encounter: Payer: Self-pay | Admitting: Family Medicine

## 2022-04-21 ENCOUNTER — Ambulatory Visit: Payer: Medicaid Other | Admitting: *Deleted

## 2022-04-21 ENCOUNTER — Encounter: Payer: Medicaid Other | Admitting: Family Medicine

## 2022-04-21 VITALS — BP 127/53 | HR 75

## 2022-04-21 DIAGNOSIS — O0990 Supervision of high risk pregnancy, unspecified, unspecified trimester: Secondary | ICD-10-CM | POA: Insufficient documentation

## 2022-04-21 DIAGNOSIS — D649 Anemia, unspecified: Secondary | ICD-10-CM | POA: Diagnosis not present

## 2022-04-21 DIAGNOSIS — Z8759 Personal history of other complications of pregnancy, childbirth and the puerperium: Secondary | ICD-10-CM | POA: Insufficient documentation

## 2022-04-21 DIAGNOSIS — O99342 Other mental disorders complicating pregnancy, second trimester: Secondary | ICD-10-CM | POA: Diagnosis not present

## 2022-04-21 DIAGNOSIS — D563 Thalassemia minor: Secondary | ICD-10-CM

## 2022-04-21 DIAGNOSIS — O285 Abnormal chromosomal and genetic finding on antenatal screening of mother: Secondary | ICD-10-CM

## 2022-04-21 DIAGNOSIS — F1991 Other psychoactive substance use, unspecified, in remission: Secondary | ICD-10-CM | POA: Insufficient documentation

## 2022-04-21 DIAGNOSIS — O09292 Supervision of pregnancy with other poor reproductive or obstetric history, second trimester: Secondary | ICD-10-CM

## 2022-04-21 DIAGNOSIS — O09892 Supervision of other high risk pregnancies, second trimester: Secondary | ICD-10-CM | POA: Diagnosis present

## 2022-04-21 DIAGNOSIS — O99012 Anemia complicating pregnancy, second trimester: Secondary | ICD-10-CM | POA: Diagnosis not present

## 2022-04-21 DIAGNOSIS — F4323 Adjustment disorder with mixed anxiety and depressed mood: Secondary | ICD-10-CM | POA: Diagnosis not present

## 2022-04-21 DIAGNOSIS — Z3A24 24 weeks gestation of pregnancy: Secondary | ICD-10-CM

## 2022-04-21 NOTE — Progress Notes (Signed)
Patient did not keep appointment today. She will be called to reschedule.  

## 2022-04-28 ENCOUNTER — Ambulatory Visit (INDEPENDENT_AMBULATORY_CARE_PROVIDER_SITE_OTHER): Payer: Medicaid Other | Admitting: Obstetrics and Gynecology

## 2022-04-28 ENCOUNTER — Other Ambulatory Visit: Payer: Self-pay | Admitting: Obstetrics and Gynecology

## 2022-04-28 VITALS — BP 135/84 | HR 85 | Wt 144.2 lb

## 2022-04-28 DIAGNOSIS — Z9189 Other specified personal risk factors, not elsewhere classified: Secondary | ICD-10-CM

## 2022-04-28 DIAGNOSIS — Z8759 Personal history of other complications of pregnancy, childbirth and the puerperium: Secondary | ICD-10-CM | POA: Insufficient documentation

## 2022-04-28 DIAGNOSIS — Z3A25 25 weeks gestation of pregnancy: Secondary | ICD-10-CM

## 2022-04-28 DIAGNOSIS — O0990 Supervision of high risk pregnancy, unspecified, unspecified trimester: Secondary | ICD-10-CM

## 2022-04-28 NOTE — Progress Notes (Signed)
   PRENATAL VISIT NOTE  Subjective:  Jean Roberts is a 21 y.o. G2P1001 at [redacted]w[redacted]d being seen today for ongoing prenatal care.  She is currently monitored for the following issues for this high-risk pregnancy and has Adjustment disorder with mixed anxiety and depressed mood; Unsuccessful suicide attempt [facility]); Intentional drug overdose (HCC); Patient needs psychiatric hold for evaluation; Supervision of high risk pregnancy, antepartum; History of gestational hypertension; and History of drug overdose on their problem list.  Patient doing well with no acute concerns today. She reports no complaints.  Contractions: Not present. Vag. Bleeding: None.  Movement: Present. Denies leaking of fluid.   Discussed need for baby ASA due to hx gestational HTN.  Pt states she will pick up today.  Discussed birth control options.  Pt has received pamphlets on nexplanon and IUD.  Pt interested in IUD  The following portions of the patient's history were reviewed and updated as appropriate: allergies, current medications, past family history, past medical history, past social history, past surgical history and problem list. Problem list updated.  Objective:   Vitals:   04/28/22 0846  BP: 135/84  Pulse: 85  Weight: 144 lb 3.2 oz (65.4 kg)    Fetal Status: Fetal Heart Rate (bpm): 135 Fundal Height: 25 cm Movement: Present     General:  Alert, oriented and cooperative. Patient is in no acute distress.  Skin: Skin is warm and dry. No rash noted.   Cardiovascular: Normal heart rate noted  Respiratory: Normal respiratory effort, no problems with respiration noted  Abdomen: Soft, gravid, appropriate for gestational age.  Pain/Pressure: Present     Pelvic: Cervical exam deferred        Extremities: Normal range of motion.  Edema: None  Mental Status:  Normal mood and affect. Normal behavior. Normal judgment and thought content.   Assessment and Plan:  Pregnancy: G2P1001 at [redacted]w[redacted]d  1. [redacted] weeks gestation of  pregnancy   2. Supervision of high risk pregnancy, antepartum Continue routine care, 2 hour GTT next visit  3. History of gestational hypertension Pt urged to initiate baby Asa  4. History of drug overdose Pt in stable mindframe currently  Preterm labor symptoms and general obstetric precautions including but not limited to vaginal bleeding, contractions, leaking of fluid and fetal movement were reviewed in detail with the patient.  Please refer to After Visit Summary for other counseling recommendations.   Return in about 2 weeks (around 05/12/2022) for [facility], in person, 2 hr GTT, 3rd trim labs.   Mariel Aloe, MD Faculty Attending Center for [facility]

## 2022-05-04 ENCOUNTER — Encounter: Payer: Self-pay | Admitting: Obstetrics & Gynecology

## 2022-05-06 ENCOUNTER — Ambulatory Visit (INDEPENDENT_AMBULATORY_CARE_PROVIDER_SITE_OTHER): Payer: Medicaid Other

## 2022-05-06 ENCOUNTER — Other Ambulatory Visit (HOSPITAL_COMMUNITY)
Admission: RE | Admit: 2022-05-06 | Discharge: 2022-05-06 | Disposition: A | Discharge status: Home / Self Care | Payer: Medicaid Other | Source: Ambulatory Visit | Attending: Family Medicine | Admitting: Family Medicine

## 2022-05-06 ENCOUNTER — Other Ambulatory Visit: Payer: Self-pay

## 2022-05-06 VITALS — BP 114/67 | HR 91 | Wt 141.8 lb

## 2022-05-06 DIAGNOSIS — O26892 Other specified pregnancy related conditions, second trimester: Secondary | ICD-10-CM

## 2022-05-06 DIAGNOSIS — O0990 Supervision of high risk pregnancy, unspecified, unspecified trimester: Secondary | ICD-10-CM

## 2022-05-06 DIAGNOSIS — N898 Other specified noninflammatory disorders of vagina: Secondary | ICD-10-CM

## 2022-05-06 DIAGNOSIS — Z3480 Encounter for supervision of other normal pregnancy, unspecified trimester: Secondary | ICD-10-CM

## 2022-05-06 MED ORDER — PREPLUS 27-1 MG PO TABS: 1.0000 | ORAL_TABLET | Freq: Every day | ORAL | 13 refills | Status: DC

## 2022-05-06 NOTE — Progress Notes (Signed)
Pt here today for vaginal swab due to vaginal irritation and discharge with odor x 1 week. Pt was seen last for OB appt 2 weeks ago. Denies vaginal itching. Denies STD exposure. Self swab collected today. Pt advised results will take 24-48 hours and will see results in mychart and will be notified if needs further treatment. Pt verbalized understanding. Pt reports +FM.  Nirvi Boehler,RNC

## 2022-05-07 ENCOUNTER — Other Ambulatory Visit: Payer: Self-pay

## 2022-05-07 ENCOUNTER — Telehealth: Payer: Self-pay

## 2022-05-07 DIAGNOSIS — A749 Chlamydial infection, unspecified: Secondary | ICD-10-CM

## 2022-05-07 DIAGNOSIS — B9689 Other specified bacterial agents as the cause of diseases classified elsewhere: Secondary | ICD-10-CM

## 2022-05-07 DIAGNOSIS — B3731 Acute candidiasis of vulva and vagina: Secondary | ICD-10-CM

## 2022-05-07 LAB — CERVICOVAGINAL ANCILLARY ONLY
Bacterial Vaginitis (gardnerella): POSITIVE — AB
Candida Glabrata: NEGATIVE
Candida Vaginitis: POSITIVE — AB
Chlamydia: POSITIVE — AB
Comment: NEGATIVE
Comment: NEGATIVE
Comment: NEGATIVE
Comment: NEGATIVE
Comment: NEGATIVE
Comment: NORMAL
Neisseria Gonorrhea: NEGATIVE
Trichomonas: NEGATIVE

## 2022-05-07 MED ORDER — METRONIDAZOLE 500 MG PO TABS: 500.0000 mg | ORAL_TABLET | Freq: Two times a day (BID) | ORAL | 0 refills | Status: DC

## 2022-05-07 MED ORDER — AZITHROMYCIN 250 MG PO TABS: 1000.0000 mg | ORAL_TABLET | Freq: Once | ORAL | 0 refills | Status: AC

## 2022-05-07 MED ORDER — TERCONAZOLE 0.4 % VA CREA: 1.0000 | TOPICAL_CREAM | Freq: Every day | VAGINAL | 0 refills | Status: DC

## 2022-05-07 NOTE — Telephone Encounter (Signed)
Contacted patient regarding vaginal swab results. Name and DOB verified prior to discussing results. Patient with +chlamydia result. Reviewed this is an STI and that both she and her partner(s) need to be treated and to abstain from intercourse for at least 7 days after all parties have been treated. Result also +BV and yeast so will send treatment for those as well. Patient has NKDA. Pharmacy verified. Rx sent to pharmacy. All questions answered and patient verbalizes understanding.    Brand Males, CNM

## 2022-05-19 ENCOUNTER — Other Ambulatory Visit: Payer: Self-pay

## 2022-05-19 DIAGNOSIS — O0990 Supervision of high risk pregnancy, unspecified, unspecified trimester: Secondary | ICD-10-CM

## 2022-05-20 ENCOUNTER — Encounter: Payer: Self-pay | Admitting: Family Medicine

## 2022-05-20 ENCOUNTER — Other Ambulatory Visit: Payer: Medicaid Other

## 2022-05-20 ENCOUNTER — Other Ambulatory Visit: Payer: Self-pay

## 2022-05-20 ENCOUNTER — Ambulatory Visit (INDEPENDENT_AMBULATORY_CARE_PROVIDER_SITE_OTHER): Payer: Medicaid Other | Admitting: Family Medicine

## 2022-05-20 VITALS — BP 110/70 | HR 91 | Wt 146.0 lb

## 2022-05-20 DIAGNOSIS — Z3A28 28 weeks gestation of pregnancy: Secondary | ICD-10-CM

## 2022-05-20 DIAGNOSIS — Z23 Encounter for immunization: Secondary | ICD-10-CM

## 2022-05-20 DIAGNOSIS — O0993 Supervision of high risk pregnancy, unspecified, third trimester: Secondary | ICD-10-CM

## 2022-05-20 DIAGNOSIS — O0990 Supervision of high risk pregnancy, unspecified, unspecified trimester: Secondary | ICD-10-CM

## 2022-05-20 DIAGNOSIS — O98819 Other maternal infectious and parasitic diseases complicating pregnancy, unspecified trimester: Secondary | ICD-10-CM | POA: Insufficient documentation

## 2022-05-20 DIAGNOSIS — Z8759 Personal history of other complications of pregnancy, childbirth and the puerperium: Secondary | ICD-10-CM

## 2022-05-20 DIAGNOSIS — A749 Chlamydial infection, unspecified: Secondary | ICD-10-CM | POA: Insufficient documentation

## 2022-05-20 DIAGNOSIS — O98813 Other maternal infectious and parasitic diseases complicating pregnancy, third trimester: Secondary | ICD-10-CM

## 2022-05-20 NOTE — Patient Instructions (Signed)

## 2022-05-20 NOTE — Progress Notes (Signed)
   Subjective:  Jean Roberts is a 21 y.o. G2P1001 at [redacted]w[redacted]d being seen today for ongoing prenatal care.  She is currently monitored for the following issues for this low-risk pregnancy and has Adjustment disorder with mixed anxiety and depressed mood; Unsuccessful suicide attempt [facility]); Intentional drug overdose (HCC); Patient needs psychiatric hold for evaluation; Supervision of high risk pregnancy, antepartum; History of gestational hypertension; History of drug overdose; and Chlamydia infection during pregnancy on their problem list.  Patient reports no complaints.  Contractions: Not present. Vag. Bleeding: None.  Movement: Present. Denies leaking of fluid.   The following portions of the patient's history were reviewed and updated as appropriate: allergies, current medications, past family history, past medical history, past social history, past surgical history and problem list. Problem list updated.  Objective:   Vitals:   05/20/22 1030  BP: 110/70  Pulse: 91  Weight: 146 lb (66.2 kg)    Fetal Status: Fetal Heart Rate (bpm): 138   Movement: Present     General:  Alert, oriented and cooperative. Patient is in no acute distress.  Skin: Skin is warm and dry. No rash noted.   Cardiovascular: Normal heart rate noted  Respiratory: Normal respiratory effort, no problems with respiration noted  Abdomen: Soft, gravid, appropriate for gestational age. Pain/Pressure: Present     Pelvic: Vag. Bleeding: None     Cervical exam deferred        Extremities: Normal range of motion.  Edema: None  Mental Status: Normal mood and affect. Normal behavior. Normal judgment and thought content.   Urinalysis:      Assessment and Plan:  Pregnancy: G2P1001 at [redacted]w[redacted]d  1. Chlamydia infection during pregnancy +05/06/2022 Reports she did take treatment TOC at next visit, too soon today  2. Supervision of high risk pregnancy, antepartum BP and FHR normal 28wk labs today Accepts TDAP, declined flu  3.  History of gestational hypertension Not taking ASA but will pick up today Normotensive so far in this pregnancy  Preterm labor symptoms and general obstetric precautions including but not limited to vaginal bleeding, contractions, leaking of fluid and fetal movement were reviewed in detail with the patient. Please refer to After Visit Summary for other counseling recommendations.  Return in 2 weeks (on 06/03/2022) for [facility], ob visit.   Venora Maples, MD

## 2022-05-21 LAB — CBC
Hematocrit: 32.3 % — ABNORMAL LOW (ref 34.0–46.6)
Hemoglobin: 9.4 g/dL — ABNORMAL LOW (ref 11.1–15.9)
MCH: 21 pg — ABNORMAL LOW (ref 26.6–33.0)
MCHC: 29.1 g/dL — ABNORMAL LOW (ref 31.5–35.7)
MCV: 72 fL — ABNORMAL LOW (ref 79–97)
Platelets: 263 10*3/uL (ref 150–450)
RBC: 4.48 x10E6/uL (ref 3.77–5.28)
RDW: 15 % (ref 11.7–15.4)
WBC: 11.2 10*3/uL — ABNORMAL HIGH (ref 3.4–10.8)

## 2022-05-21 LAB — GLUCOSE TOLERANCE, 2 HOURS W/ 1HR
Glucose, 1 hour: 142 mg/dL (ref 70–179)
Glucose, 2 hour: 72 mg/dL (ref 70–152)
Glucose, Fasting: 84 mg/dL (ref 70–91)

## 2022-05-21 LAB — HIV ANTIBODY (ROUTINE TESTING W REFLEX): HIV Screen 4th Generation wRfx: NONREACTIVE

## 2022-05-21 LAB — RPR: RPR Ser Ql: NONREACTIVE

## 2022-05-30 ENCOUNTER — Encounter: Payer: Self-pay | Admitting: Family Medicine

## 2022-06-02 ENCOUNTER — Other Ambulatory Visit (HOSPITAL_COMMUNITY)
Admission: RE | Admit: 2022-06-02 | Discharge: 2022-06-02 | Disposition: A | Discharge status: Home / Self Care | Payer: Medicaid Other | Source: Ambulatory Visit

## 2022-06-02 ENCOUNTER — Other Ambulatory Visit: Payer: Self-pay

## 2022-06-02 ENCOUNTER — Ambulatory Visit (INDEPENDENT_AMBULATORY_CARE_PROVIDER_SITE_OTHER): Payer: Medicaid Other

## 2022-06-02 VITALS — BP 123/56 | HR 96 | Wt 150.0 lb

## 2022-06-02 DIAGNOSIS — N949 Unspecified condition associated with female genital organs and menstrual cycle: Secondary | ICD-10-CM

## 2022-06-02 DIAGNOSIS — O98819 Other maternal infectious and parasitic diseases complicating pregnancy, unspecified trimester: Secondary | ICD-10-CM

## 2022-06-02 DIAGNOSIS — A749 Chlamydial infection, unspecified: Secondary | ICD-10-CM

## 2022-06-02 DIAGNOSIS — Z3A30 30 weeks gestation of pregnancy: Secondary | ICD-10-CM

## 2022-06-02 DIAGNOSIS — O99013 Anemia complicating pregnancy, third trimester: Secondary | ICD-10-CM

## 2022-06-02 DIAGNOSIS — O0990 Supervision of high risk pregnancy, unspecified, unspecified trimester: Secondary | ICD-10-CM

## 2022-06-02 DIAGNOSIS — Z8759 Personal history of other complications of pregnancy, childbirth and the puerperium: Secondary | ICD-10-CM

## 2022-06-02 NOTE — Progress Notes (Signed)
   PRENATAL VISIT NOTE  Subjective:  Jean Roberts is a 21 y.o. G2P1001 at [redacted]w[redacted]d being seen today for ongoing prenatal care.  She is currently monitored for the following issues for this high-risk pregnancy and has Adjustment disorder with mixed anxiety and depressed mood; Unsuccessful suicide attempt [facility]); Intentional drug overdose ([place]); Patient needs psychiatric hold for evaluation; Supervision of high risk pregnancy, antepartum; History of gestational hypertension; History of drug overdose; and Chlamydia infection during pregnancy on their problem list.  Patient reports sharp pain in lower abdomen that occurs when she rolls over at night, lifting legs, walking, or standing. Warm bath relieves pain. Contractions: Not present. Vag. Bleeding: None.  Movement: Present. Denies leaking of fluid.   The following portions of the patient's history were reviewed and updated as appropriate: allergies, current medications, past family history, past medical history, past social history, past surgical history and problem list.   Objective:   Vitals:   06/02/22 1443  BP: (!) 123/56  Pulse: 96  Weight: 150 lb (68 kg)    Fetal Status: Fetal Heart Rate (bpm): 139 Fundal Height: 30 cm Movement: Present     General:  Alert, oriented and cooperative. Patient is in no acute distress.  Skin: Skin is warm and dry. No rash noted.   Cardiovascular: Normal heart rate noted  Respiratory: Normal respiratory effort, no problems with respiration noted  Abdomen: Soft, gravid, appropriate for gestational age.  Pain/Pressure: Present     Pelvic: Cervical exam deferred        Extremities: Normal range of motion.  Edema: None  Mental Status: Normal mood and affect. Normal behavior. Normal judgment and thought content.   Assessment and Plan:  Pregnancy: G2P1001 at [redacted]w[redacted]d 1. Supervision of high risk pregnancy, antepartum - Routine OB. Doing well - Anticipatory guidance for upcoming appointments provided  2. [redacted]  weeks gestation of pregnancy - FH appropriate - Endorses active fetal movement  3. Chlamydia infection during pregnancy - Completed medication - TOC today  - Cervicovaginal ancillary only( [place])  4. History of gestational hypertension - Not taking bASA- did not pick up - Normotensive today  5. Anemia in pregnancy, third trimester - Not taking iron supplement. Encouraged to pick up and start taking - Reviewed dietary sources of iron   6. Round ligament pain - Encouraged Tylenol, heat/ice, support belt or athletic tape, changing positions slowly - Reassurance provided   Preterm labor symptoms and general obstetric precautions including but not limited to vaginal bleeding, contractions, leaking of fluid and fetal movement were reviewed in detail with the patient. Please refer to After Visit Summary for other counseling recommendations.   Return in about 2 weeks (around 06/16/2022) for LOB.  Future Appointments  Date Time Provider [place]  06/16/2022  3:15 PM Chancy Milroy, MD [facility], CNM

## 2022-06-03 LAB — CERVICOVAGINAL ANCILLARY ONLY
Bacterial Vaginitis (gardnerella): NEGATIVE
Candida Glabrata: NEGATIVE
Candida Vaginitis: POSITIVE — AB
Chlamydia: NEGATIVE
Comment: NEGATIVE
Comment: NEGATIVE
Comment: NEGATIVE
Comment: NEGATIVE
Comment: NEGATIVE
Comment: NORMAL
Neisseria Gonorrhea: NEGATIVE
Trichomonas: NEGATIVE

## 2022-06-04 ENCOUNTER — Encounter: Payer: Self-pay | Admitting: Family Medicine

## 2022-06-04 ENCOUNTER — Telehealth: Payer: Self-pay | Admitting: General Practice

## 2022-06-04 DIAGNOSIS — B3731 Acute candidiasis of vulva and vagina: Secondary | ICD-10-CM

## 2022-06-04 MED ORDER — TERCONAZOLE 0.4 % VA CREA: 1.0000 | TOPICAL_CREAM | Freq: Every day | VAGINAL | 0 refills | Status: DC

## 2022-06-04 NOTE — Telephone Encounter (Signed)
-----   Message from Renee Harder, CNM sent at 06/04/2022 10:16 AM EDT ----- Chlamydia TOC was negative. Patient is positive for yeast. Can send in Terazol.  Thanks! Andee Poles

## 2022-06-04 NOTE — Telephone Encounter (Signed)
Called patient and informed her of results. Asked if she was having a discharge or itching. Patient states yes. Discussed with patient Rx for terazol sent to pharmacy. Patient verbalized understanding.

## 2022-06-16 ENCOUNTER — Encounter: Payer: Self-pay | Admitting: Obstetrics and Gynecology

## 2022-06-16 ENCOUNTER — Ambulatory Visit (INDEPENDENT_AMBULATORY_CARE_PROVIDER_SITE_OTHER): Payer: Medicaid Other | Admitting: Obstetrics and Gynecology

## 2022-06-16 VITALS — BP 119/55 | HR 77 | Wt 153.6 lb

## 2022-06-16 DIAGNOSIS — Z8759 Personal history of other complications of pregnancy, childbirth and the puerperium: Secondary | ICD-10-CM

## 2022-06-16 DIAGNOSIS — O0990 Supervision of high risk pregnancy, unspecified, unspecified trimester: Secondary | ICD-10-CM

## 2022-06-16 MED ORDER — FLUCONAZOLE 150 MG PO TABS: 150.0000 mg | ORAL_TABLET | Freq: Once | ORAL | 0 refills | Status: AC

## 2022-06-16 NOTE — Progress Notes (Signed)
Subjective:  Jean Roberts is a 21 y.o. G2P1001 at [redacted]w[redacted]d being seen today for ongoing prenatal care.  She is currently monitored for the following issues for this low-risk pregnancy and has Adjustment disorder with mixed anxiety and depressed mood; Unsuccessful suicide attempt [facility]); Intentional drug overdose ([place]); Patient needs psychiatric hold for evaluation; Supervision of high risk pregnancy, antepartum; History of gestational hypertension; and History of drug overdose on their problem list.  Patient reports general discomforts of pregnancy.  Contractions: Not present. Vag. Bleeding: None.  Movement: Present. Denies leaking of fluid.   The following portions of the patient's history were reviewed and updated as appropriate: allergies, current medications, past family history, past medical history, past social history, past surgical history and problem list. Problem list updated.  Objective:   Vitals:   06/16/22 1509  BP: (!) 119/55  Pulse: 77  Weight: 69.7 kg    Fetal Status: Fetal Heart Rate (bpm): 136   Movement: Present     General:  Alert, oriented and cooperative. Patient is in no acute distress.  Skin: Skin is warm and dry. No rash noted.   Cardiovascular: Normal heart rate noted  Respiratory: Normal respiratory effort, no problems with respiration noted  Abdomen: Soft, gravid, appropriate for gestational age. Pain/Pressure: Present     Pelvic:  Cervical exam deferred        Extremities: Normal range of motion.     Mental Status: Normal mood and affect. Normal behavior. Normal judgment and thought content.   Urinalysis:      Assessment and Plan:  Pregnancy: G2P1001 at [redacted]w[redacted]d  1. Supervision of high risk pregnancy, antepartum Stable - fluconazole (DIFLUCAN) 150 MG tablet; Take 1 tablet (150 mg total) by mouth once for 1 dose. Can take additional dose three days later if symptoms persist  Dispense: 1 tablet; Refill: 0  2. History of gestational hypertension BP  normal   Preterm labor symptoms and general obstetric precautions including but not limited to vaginal bleeding, contractions, leaking of fluid and fetal movement were reviewed in detail with the patient. Please refer to After Visit Summary for other counseling recommendations.  Return in about 2 weeks (around 06/30/2022) for OB visit, face to face, any provider.   Chancy Milroy, MD

## 2022-06-16 NOTE — Patient Instructions (Signed)

## 2022-06-28 ENCOUNTER — Encounter: Payer: Self-pay | Admitting: Obstetrics & Gynecology

## 2022-06-30 ENCOUNTER — Ambulatory Visit (INDEPENDENT_AMBULATORY_CARE_PROVIDER_SITE_OTHER): Payer: Medicaid Other | Admitting: General Practice

## 2022-06-30 VITALS — BP 128/75 | HR 84

## 2022-06-30 DIAGNOSIS — O368130 Decreased fetal movements, third trimester, not applicable or unspecified: Secondary | ICD-10-CM
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
## 2022-07-01 ENCOUNTER — Encounter: Payer: Self-pay | Admitting: Obstetrics & Gynecology

## 2022-07-03 ENCOUNTER — Inpatient Hospital Stay (HOSPITAL_COMMUNITY)
Admission: AD | Admit: 2022-07-03 | Discharge: 2022-07-03 | Disposition: A | Discharge status: Home / Self Care | Payer: Medicaid Other | Attending: Obstetrics & Gynecology | Admitting: Obstetrics & Gynecology

## 2022-07-03 ENCOUNTER — Ambulatory Visit (INDEPENDENT_AMBULATORY_CARE_PROVIDER_SITE_OTHER): Payer: Medicaid Other

## 2022-07-03 ENCOUNTER — Encounter (HOSPITAL_COMMUNITY): Payer: Self-pay | Admitting: Obstetrics & Gynecology

## 2022-07-03 ENCOUNTER — Other Ambulatory Visit: Payer: Self-pay

## 2022-07-03 VITALS — BP 140/84 | HR 88 | Wt 160.1 lb

## 2022-07-03 DIAGNOSIS — O169 Unspecified maternal hypertension, unspecified trimester: Secondary | ICD-10-CM | POA: Insufficient documentation

## 2022-07-03 DIAGNOSIS — Z3A34 34 weeks gestation of pregnancy: Secondary | ICD-10-CM

## 2022-07-03 DIAGNOSIS — R030 Elevated blood-pressure reading, without diagnosis of hypertension: Secondary | ICD-10-CM

## 2022-07-03 DIAGNOSIS — Z8759 Personal history of other complications of pregnancy, childbirth and the puerperium: Secondary | ICD-10-CM

## 2022-07-03 DIAGNOSIS — O26893 Other specified pregnancy related conditions, third trimester: Secondary | ICD-10-CM | POA: Diagnosis not present

## 2022-07-03 DIAGNOSIS — O09293 Supervision of pregnancy with other poor reproductive or obstetric history, third trimester: Secondary | ICD-10-CM | POA: Insufficient documentation

## 2022-07-03 DIAGNOSIS — Z3493 Encounter for supervision of normal pregnancy, unspecified, third trimester: Secondary | ICD-10-CM

## 2022-07-03 HISTORY — DX: Unspecified maternal hypertension, unspecified trimester: O16.9

## 2022-07-03 LAB — URINALYSIS, ROUTINE W REFLEX MICROSCOPIC
Bacteria, UA: NONE SEEN
Bilirubin Urine: NEGATIVE
Glucose, UA: NEGATIVE mg/dL
Hgb urine dipstick: NEGATIVE
Ketones, ur: NEGATIVE mg/dL
Nitrite: NEGATIVE
Protein, ur: NEGATIVE mg/dL
Specific Gravity, Urine: 1.012 (ref 1.005–1.030)
pH: 6 (ref 5.0–8.0)

## 2022-07-03 LAB — CBC
HCT: 25.1 % — ABNORMAL LOW (ref 36.0–46.0)
Hemoglobin: 8.1 g/dL — ABNORMAL LOW (ref 12.0–15.0)
MCH: 21.1 pg — ABNORMAL LOW (ref 26.0–34.0)
MCHC: 32.3 g/dL (ref 30.0–36.0)
MCV: 65.5 fL — ABNORMAL LOW (ref 80.0–100.0)
Platelets: 236 10*3/uL (ref 150–400)
RBC: 3.83 MIL/uL — ABNORMAL LOW (ref 3.87–5.11)
RDW: 15.1 % (ref 11.5–15.5)
WBC: 9.9 10*3/uL (ref 4.0–10.5)
nRBC: 0 % (ref 0.0–0.2)

## 2022-07-03 LAB — COMPREHENSIVE METABOLIC PANEL
ALT: 10 U/L (ref 0–44)
AST: 18 U/L (ref 15–41)
Albumin: 2.5 g/dL — ABNORMAL LOW (ref 3.5–5.0)
Alkaline Phosphatase: 176 U/L — ABNORMAL HIGH (ref 38–126)
Anion gap: 9 (ref 5–15)
BUN: 7 mg/dL (ref 6–20)
CO2: 21 mmol/L — ABNORMAL LOW (ref 22–32)
Calcium: 8.8 mg/dL — ABNORMAL LOW (ref 8.9–10.3)
Chloride: 105 mmol/L (ref 98–111)
Creatinine, Ser: 0.68 mg/dL (ref 0.44–1.00)
GFR, Estimated: >60 mL/min (ref >60)
Glucose, Bld: 99 mg/dL (ref 70–99)
Potassium: 3.4 mmol/L — ABNORMAL LOW (ref 3.5–5.1)
Sodium: 135 mmol/L (ref 135–145)
Total Bilirubin: 0.2 mg/dL — ABNORMAL LOW (ref 0.3–1.2)
Total Protein: 5.9 g/dL — ABNORMAL LOW (ref 6.5–8.1)

## 2022-07-03 LAB — PROTEIN / CREATININE RATIO, URINE
Creatinine, Urine: 92 mg/dL
Protein Creatinine Ratio: 0.12 mg/mg{Cre} (ref 0.00–0.15)
Total Protein, Urine: 11 mg/dL

## 2022-07-03 MED ORDER — METOCLOPRAMIDE HCL 10 MG PO TABS
10.0000 mg | ORAL_TABLET | Freq: Once | ORAL | Status: AC
Start: 2022-07-03 — End: 2022-07-03
  Administered 2022-07-03: 10 mg via ORAL
  Filled 2022-07-03: qty 1

## 2022-07-03 MED ORDER — ACETAMINOPHEN-CAFFEINE 500-65 MG PO TABS
2.0000 | ORAL_TABLET | Freq: Once | ORAL | Status: AC
  Administered 2022-07-03: 2 via ORAL
  Filled 2022-07-03: qty 2

## 2022-07-03 NOTE — Progress Notes (Deleted)
   PRENATAL VISIT NOTE  Subjective:  Jean Roberts is a 21 y.o. G2P1001 at [redacted]w[redacted]d being seen today for ongoing prenatal care.  She is currently monitored for the following issues for this low-risk pregnancy and has Adjustment disorder with mixed anxiety and depressed mood; Unsuccessful suicide attempt [facility]); Intentional drug overdose ([place]); Patient needs psychiatric hold for evaluation; Supervision of high risk pregnancy, antepartum; History of gestational hypertension; and History of drug overdose on their problem list.  Patient reports {sx:14538}.  Contractions: Irritability. Vag. Bleeding: None.  Movement: Present. Denies leaking of fluid.   The following portions of the patient's history were reviewed and updated as appropriate: allergies, current medications, past family history, past medical history, past social history, past surgical history and problem list.   Objective:   Vitals:   07/03/22 1650  BP: (!) 140/84  Pulse: 88  Weight: 160 lb 1.6 oz (72.6 kg)    Fetal Status: Fetal Heart Rate (bpm): 159   Movement: Present     General:  Alert, oriented and cooperative. Patient is in no acute distress.  Skin: Skin is warm and dry. No rash noted.   Cardiovascular: Normal heart rate noted  Respiratory: Normal respiratory effort, no problems with respiration noted  Abdomen: Soft, gravid, appropriate for gestational age.  Pain/Pressure: Present     Pelvic: {Blank single:19197::"Cervical exam performed in the presence of a chaperone","Cervical exam deferred"}        Extremities: Normal range of motion.  Edema: None  Mental Status: Normal mood and affect. Normal behavior. Normal judgment and thought content.   Assessment and Plan:  Pregnancy: G2P1001 at [redacted]w[redacted]d There are no diagnoses linked to this encounter.  Preterm labor symptoms and general obstetric precautions including but not limited to vaginal bleeding, contractions, leaking of fluid and fetal movement were reviewed in detail  with the patient. Please refer to After Visit Summary for other counseling recommendations.   Return in about 2 weeks (around 07/17/2022) for LOB.  Future Appointments  Date Time Provider [place]  07/17/2022  2:15 PM Concepcion Living, MD [facility]  07/23/2022  4:15 PM Gabriel Carina, CNM [facility]  07/30/2022  3:15 PM Helaine Chess [facility]  08/06/2022  3:35 PM Helaine Chess [facility]  08/13/2022  2:15 PM [facility]  08/13/2022  3:15 PM Gabriel Carina, CNM [facility] [place], CNM
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
## 2022-07-03 NOTE — MAU Note (Signed)
Jean Roberts is a 21 y.o. at [redacted]w[redacted]d here in MAU reporting: constant headache for 2 weeks and elevated BP in doctor office. Patient monitors BP occasionally at home and denies any elevated BP. Patient took tylenol 1 500mg  tablet this morning and HA was not relieved. Denies any blurred vision. Denies any excess swelling. Denies any LOF or VB. +FM.  Pain score: 7 Vitals:   07/03/22 1816  BP: 130/62  Pulse: 79  Resp: 15  Temp: 97.7 F (36.5 C)  SpO2: 100%

## 2022-07-03 NOTE — MAU Provider Note (Signed)
History     CSN: [id number]  Arrival date and time: 07/03/22 1723   Event Date/Time   First Provider Initiated Contact with Patient 07/03/22 1819      Chief Complaint  Patient presents with   Headache   Hypertension   HPI  Jean Roberts is a 21 y.o. G2P1001 at [redacted]w[redacted]d who presents from the office for evaluation of elevated blood pressures. Patient reports she has not had any issues with her blood pressure this pregnancy but reports a hx of gestational hypertension in her last pregnancy. She denies any visual changes or epigastric pain. She reports an ongoing headache for several weeks.  Patient rates the pain as a 7/10 and has tried one tylenol for the pain with no relief. She denies any vaginal bleeding, discharge, and leaking of fluid. Denies any constipation, diarrhea or any urinary complaints. Reports normal fetal movement.   OB History     Gravida  2   Para  1   Term  1   Preterm      AB      Living  1      SAB      IAB      Ectopic      Multiple  0   Live Births  1           Past Medical History:  Diagnosis Date   Gestational hypertension    Medical history non-contributory     Past Surgical History:  Procedure Laterality Date   gestational hypertension     NO PAST SURGERIES      Family History  Problem Relation Age of Onset   Healthy Mother    Healthy Father     Social History   Tobacco Use   Smoking status: Former    Types: Cigarettes   Smokeless tobacco: Former  Scientific laboratory technician Use: Former   Substances: Flavoring  Substance Use Topics   Alcohol use: Never   Drug use: Never    Allergies: No Known Allergies  No medications prior to admission.    Review of Systems  Constitutional: Negative.  Negative for fatigue and fever.  HENT: Negative.    Respiratory: Negative.  Negative for shortness of breath.   Cardiovascular: Negative.  Negative for chest pain.  Gastrointestinal: Negative.  Negative for abdominal pain,  constipation, diarrhea, nausea and vomiting.  Genitourinary: Negative.  Negative for dysuria, vaginal bleeding and vaginal discharge.  Neurological:  Positive for headaches. Negative for dizziness.   Physical Exam   Blood pressure 127/65, pulse 75, temperature 97.7 F (36.5 C), temperature source Oral, resp. rate 16, SpO2 97 %, not currently breastfeeding.  Patient Vitals for the past 24 hrs:  BP Temp Temp src Pulse Resp SpO2  07/03/22 2025 127/65 -- -- 75 16 97 %  07/03/22 2015 (!) 127/58 -- -- 78 -- 99 %  07/03/22 2000 123/78 -- -- 86 -- 100 %  07/03/22 1950 -- -- -- -- -- 100 %  07/03/22 1945 136/66 -- -- 74 -- 100 %  07/03/22 1940 -- -- -- -- -- 100 %  07/03/22 1930 135/65 -- -- 78 -- 100 %  07/03/22 1920 -- -- -- -- -- 100 %  07/03/22 1915 122/61 -- -- 71 -- 100 %  07/03/22 1910 -- -- -- -- -- 100 %  07/03/22 1908 121/60 -- -- 68 16 --  07/03/22 1900 -- -- -- -- -- 100 %  07/03/22 1850 -- -- -- -- --  100 %  07/03/22 1846 130/69 -- -- 82 15 --  07/03/22 1845 -- -- -- -- -- 100 %  07/03/22 1840 -- -- -- -- -- 100 %  07/03/22 1831 (!) 143/88 -- -- 89 15 --  07/03/22 1830 -- -- -- -- -- 100 %  07/03/22 1820 -- -- -- -- -- 100 %  07/03/22 1816 130/62 97.7 F (36.5 C) Oral 79 15 100 %  07/03/22 1810 -- -- -- -- -- 100 %    Physical Exam Vitals and nursing note reviewed.  Constitutional:      General: She is not in acute distress.    Appearance: She is well-developed.  HENT:     Head: Normocephalic.  Eyes:     Pupils: Pupils are equal, round, and reactive to light.  Cardiovascular:     Rate and Rhythm: Normal rate and regular rhythm.     Heart sounds: Normal heart sounds.  Pulmonary:     Effort: Pulmonary effort is normal. No respiratory distress.     Breath sounds: Normal breath sounds.  Abdominal:     General: Bowel sounds are normal. There is no distension.     Palpations: Abdomen is soft.     Tenderness: There is no abdominal tenderness.  Skin:    General:  Skin is warm and dry.  Neurological:     Mental Status: She is alert and oriented to person, place, and time.     Motor: No abnormal muscle tone.     Coordination: Coordination normal.     Deep Tendon Reflexes: Reflexes are normal and symmetric. Reflexes normal.  Psychiatric:        Mood and Affect: Mood normal.        Behavior: Behavior normal.        Thought Content: Thought content normal.        Judgment: Judgment normal.     Fetal Tracing:  Baseline: 120 Variability: moderate Accels: 15x15 Decels: none  Toco: occasional uc's  MAU Course  Procedures  Results for orders placed or performed during the hospital encounter of 07/03/22 (from the past 24 hour(s))  CBC     Status: Abnormal   Collection Time: 07/03/22  5:50 PM  Result Value Ref Range   WBC 9.9 4.0 - 10.5 K/uL   RBC 3.83 (L) 3.87 - 5.11 MIL/uL   Hemoglobin 8.1 (L) 12.0 - 15.0 g/dL   HCT 22.2 (L) 97.9 - 89.2 %   MCV 65.5 (L) 80.0 - 100.0 fL   MCH 21.1 (L) 26.0 - 34.0 pg   MCHC 32.3 30.0 - 36.0 g/dL   RDW 11.9 41.7 - 40.8 %   Platelets 236 150 - 400 K/uL   nRBC 0.0 0.0 - 0.2 %  Comprehensive metabolic panel     Status: Abnormal   Collection Time: 07/03/22  5:50 PM  Result Value Ref Range   Sodium 135 135 - 145 mmol/L   Potassium 3.4 (L) 3.5 - 5.1 mmol/L   Chloride 105 98 - 111 mmol/L   CO2 21 (L) 22 - 32 mmol/L   Glucose, Bld 99 70 - 99 mg/dL   BUN 7 6 - 20 mg/dL   Creatinine, Ser 1.44 0.44 - 1.00 mg/dL   Calcium 8.8 (L) 8.9 - 10.3 mg/dL   Total Protein 5.9 (L) 6.5 - 8.1 g/dL   Albumin 2.5 (L) 3.5 - 5.0 g/dL   AST 18 15 - 41 U/L   ALT 10 0 - 44 U/L  Alkaline Phosphatase 176 (H) 38 - 126 U/L   Total Bilirubin 0.2 (L) 0.3 - 1.2 mg/dL   GFR, Estimated >50 >56 mL/min   Anion gap 9 5 - 15  Protein / creatinine ratio, urine     Status: None   Collection Time: 07/03/22  6:25 PM  Result Value Ref Range   Creatinine, Urine 92 mg/dL   Total Protein, Urine 11 mg/dL   Protein Creatinine Ratio 0.12 0.00 -  0.15 mg/mg[Cre]  Urinalysis, Routine w reflex microscopic     Status: Abnormal   Collection Time: 07/03/22  6:25 PM  Result Value Ref Range   Color, Urine YELLOW YELLOW   APPearance CLEAR CLEAR   Specific Gravity, Urine 1.012 1.005 - 1.030   pH 6.0 5.0 - 8.0   Glucose, UA NEGATIVE NEGATIVE mg/dL   Hgb urine dipstick NEGATIVE NEGATIVE   Bilirubin Urine NEGATIVE NEGATIVE   Ketones, ur NEGATIVE NEGATIVE mg/dL   Protein, ur NEGATIVE NEGATIVE mg/dL   Nitrite NEGATIVE NEGATIVE   Leukocytes,Ua TRACE (A) NEGATIVE   RBC / HPF 0-5 0 - 5 RBC/hpf   WBC, UA 0-5 0 - 5 WBC/hpf   Bacteria, UA NONE SEEN NONE SEEN   Squamous Epithelial / LPF 0-5 0 - 5    MDM Labs ordered and reviewed.   UA CBC, CMP, Protein/creat ratio Excedrin tension Reglan PO  Patient reports complete resolution of HA  Assessment and Plan   1. Elevated blood pressure affecting pregnancy, antepartum   2. [redacted] weeks gestation of pregnancy     -Discharge home in stable condition -Preeclampsia precautions discussed -Patient advised to follow-up with OB next week as scheduled for a BP check -Patient may return to MAU as needed or if her condition were to change or worsen  Rolm Bookbinder, CNM 07/03/2022, 6:19 PM

## 2022-07-03 NOTE — Progress Notes (Signed)
   PRENATAL VISIT NOTE  Subjective:  Jean Roberts is a 21 y.o. G2P1001 at [redacted]w[redacted]d being seen today for ongoing prenatal care.  She is currently monitored for the following issues for this low-risk pregnancy and has Adjustment disorder with mixed anxiety and depressed mood; Unsuccessful suicide attempt [facility]); Intentional drug overdose ([place]); Patient needs psychiatric hold for evaluation; Supervision of high risk pregnancy, antepartum; History of gestational hypertension; and History of drug overdose on their problem list.  Patient reports ongoing headaches unrelieved with ES Tylenol. Rates headache 6/10 currently. She reports occasionally seeing spots in vision, however this has improved since she has started eating more. Contractions: Irritability. Vag. Bleeding: None.  Movement: Present. Denies leaking of fluid.   The following portions of the patient's history were reviewed and updated as appropriate: allergies, current medications, past family history, past medical history, past social history, past surgical history and problem list.   Objective:   Vitals:   07/03/22 1650  BP: (!) 140/84  Pulse: 88  Weight: 160 lb 1.6 oz (72.6 kg)    Fetal Status: Fetal Heart Rate (bpm): 159   Movement: Present     General:  Alert, oriented and cooperative. Patient is in no acute distress.  Skin: Skin is warm and dry. No rash noted.   Cardiovascular: Normal heart rate noted  Respiratory: Normal respiratory effort, no problems with respiration noted  Abdomen: Soft, gravid, appropriate for gestational age.  Pain/Pressure: Present     Pelvic: Cervical exam deferred        Extremities: Normal range of motion.  Edema: None  Mental Status: Normal mood and affect. Normal behavior. Normal judgment and thought content.   Assessment and Plan:  Pregnancy: G2P1001 at [redacted]w[redacted]d 1. Encounter for supervision of low-risk pregnancy in third trimester - Routine OB - Cultures next visit  2. [redacted] weeks gestation of  pregnancy - FH appropriate  - Endorses active fetal movement  3. History of gestational hypertension - Initial BP elevated. Repeat 137/84. Given hx of gHTN and headache 6/10 unrelieved by Tylenol, recommend patient be evaluated in MAU. Patient agreeable with plan of care. - MAU provider notified   4. Elevated blood pressure reading   Preterm labor symptoms and general obstetric precautions including but not limited to vaginal bleeding, contractions, leaking of fluid and fetal movement were reviewed in detail with the patient. Please refer to After Visit Summary for other counseling recommendations.   Return in about 2 weeks (around 07/17/2022) for LOB.  Future Appointments  Date Time Provider [place]  07/17/2022  2:15 PM Concepcion Living, MD [facility]  07/23/2022  4:15 PM Gabriel Carina, CNM [facility]  07/30/2022  3:15 PM Helaine Chess [facility]  08/06/2022  3:35 PM Helaine Chess [facility]  08/13/2022  2:15 PM [facility]  08/13/2022  3:15 PM Gabriel Carina, CNM [facility] [place], CNM

## 2022-07-07 ENCOUNTER — Encounter: Payer: Self-pay | Admitting: Obstetrics & Gynecology

## 2022-07-08 ENCOUNTER — Encounter: Payer: Self-pay | Admitting: Obstetrics & Gynecology

## 2022-07-11 ENCOUNTER — Encounter: Payer: Self-pay | Admitting: Obstetrics & Gynecology

## 2022-07-15 ENCOUNTER — Other Ambulatory Visit: Payer: Self-pay

## 2022-07-15 ENCOUNTER — Encounter (HOSPITAL_COMMUNITY): Payer: Self-pay | Admitting: Obstetrics and Gynecology

## 2022-07-15 ENCOUNTER — Inpatient Hospital Stay (HOSPITAL_COMMUNITY)
Admission: AD | Admit: 2022-07-15 | Discharge: 2022-07-15 | Disposition: A | Discharge status: Home / Self Care | Payer: Medicaid Other | Attending: Obstetrics and Gynecology | Admitting: Obstetrics and Gynecology

## 2022-07-15 DIAGNOSIS — O133 Gestational [pregnancy-induced] hypertension without significant proteinuria, third trimester: Secondary | ICD-10-CM

## 2022-07-15 DIAGNOSIS — Z3A36 36 weeks gestation of pregnancy: Secondary | ICD-10-CM

## 2022-07-15 DIAGNOSIS — O26893 Other specified pregnancy related conditions, third trimester: Secondary | ICD-10-CM

## 2022-07-15 DIAGNOSIS — O09293 Supervision of pregnancy with other poor reproductive or obstetric history, third trimester: Secondary | ICD-10-CM | POA: Insufficient documentation

## 2022-07-15 DIAGNOSIS — O99013 Anemia complicating pregnancy, third trimester: Secondary | ICD-10-CM | POA: Diagnosis not present

## 2022-07-15 DIAGNOSIS — O99019 Anemia complicating pregnancy, unspecified trimester: Secondary | ICD-10-CM

## 2022-07-15 DIAGNOSIS — O139 Gestational [pregnancy-induced] hypertension without significant proteinuria, unspecified trimester: Secondary | ICD-10-CM | POA: Insufficient documentation

## 2022-07-15 DIAGNOSIS — R519 Headache, unspecified: Secondary | ICD-10-CM

## 2022-07-15 LAB — COMPREHENSIVE METABOLIC PANEL
ALT: 10 U/L (ref 0–44)
AST: 21 U/L (ref 15–41)
Albumin: 2.6 g/dL — ABNORMAL LOW (ref 3.5–5.0)
Alkaline Phosphatase: 207 U/L — ABNORMAL HIGH (ref 38–126)
Anion gap: 12 (ref 5–15)
BUN: 8 mg/dL (ref 6–20)
CO2: 20 mmol/L — ABNORMAL LOW (ref 22–32)
Calcium: 9 mg/dL (ref 8.9–10.3)
Chloride: 105 mmol/L (ref 98–111)
Creatinine, Ser: 0.7 mg/dL (ref 0.44–1.00)
GFR, Estimated: >60 mL/min (ref >60)
Glucose, Bld: 72 mg/dL (ref 70–99)
Potassium: 3.6 mmol/L (ref 3.5–5.1)
Sodium: 137 mmol/L (ref 135–145)
Total Bilirubin: 0.3 mg/dL (ref 0.3–1.2)
Total Protein: 5.9 g/dL — ABNORMAL LOW (ref 6.5–8.1)

## 2022-07-15 LAB — URINALYSIS, ROUTINE W REFLEX MICROSCOPIC
Bilirubin Urine: NEGATIVE
Glucose, UA: NEGATIVE mg/dL
Hgb urine dipstick: NEGATIVE
Ketones, ur: NEGATIVE mg/dL
Nitrite: NEGATIVE
Protein, ur: NEGATIVE mg/dL
Specific Gravity, Urine: 1.01 (ref 1.005–1.030)
pH: 7 (ref 5.0–8.0)

## 2022-07-15 LAB — CBC
HCT: 25.5 % — ABNORMAL LOW (ref 36.0–46.0)
Hemoglobin: 8 g/dL — ABNORMAL LOW (ref 12.0–15.0)
MCH: 20.4 pg — ABNORMAL LOW (ref 26.0–34.0)
MCHC: 31.4 g/dL (ref 30.0–36.0)
MCV: 65.1 fL — ABNORMAL LOW (ref 80.0–100.0)
Platelets: 212 10*3/uL (ref 150–400)
RBC: 3.92 MIL/uL (ref 3.87–5.11)
RDW: 16 % — ABNORMAL HIGH (ref 11.5–15.5)
WBC: 8.3 10*3/uL (ref 4.0–10.5)
nRBC: 0 % (ref 0.0–0.2)

## 2022-07-15 LAB — PROTEIN / CREATININE RATIO, URINE
Creatinine, Urine: 96 mg/dL
Protein Creatinine Ratio: 0.09 mg/mg{Cre} (ref 0.00–0.15)
Total Protein, Urine: 9 mg/dL

## 2022-07-15 MED ORDER — BUTALBITAL-APAP-CAFFEINE 50-325-40 MG PO TABS: 1.0000 | ORAL_TABLET | Freq: Four times a day (QID) | ORAL | 0 refills | Status: DC | PRN

## 2022-07-15 MED ORDER — BUTALBITAL-APAP-CAFFEINE 50-325-40 MG PO TABS
2.0000 | ORAL_TABLET | Freq: Once | ORAL | Status: AC
  Administered 2022-07-15: 2 via ORAL
  Filled 2022-07-15: qty 2

## 2022-07-15 NOTE — Discharge Instructions (Signed)

## 2022-07-15 NOTE — MAU Note (Signed)
Jean Roberts is a 21 y.o. at [redacted]w[redacted]d here in MAU reporting: ongoing headaches for several weeks and is seeing intermittent black spots. Took 500mg  tylenol this AM with no relief. No bleeding or LOF. +FM  Onset of complaint: ongoing  Pain score: 3/10  Vitals:   07/15/22 1628  BP: 139/81  Pulse: 78  Resp: 16  Temp: 98.1 F (36.7 C)  SpO2: 99%     FHT:+FM, EFM applied  Lab orders placed from triage: UA

## 2022-07-15 NOTE — MAU Provider Note (Signed)
History     CSN: [id number]  Arrival date and time: 07/15/22 1544   Event Date/Time   First Provider Initiated Contact with Patient 07/15/22 1629      Chief Complaint  Patient presents with   Headache   Jean Roberts is a 21 y/o F G2P1001 currently [redacted]w[redacted]d gestation presenting for concern of ongoing headaches. Patient states that she was recently seen at MAU on 10/26 for elevated blood pressure and she was given precautions of what to return for. Since 10/16 she has been having intermittent headaches. She developed "black floaters" in her vision 4 days ago and since then has intermittently been having black floaters in her vision, which last for a few minutes. Last episode was today while waiting in the lobby at MAU. She denies loss of vision, blurry vision, eye pain, vomiting. She endorses one episode of chest pain 4 days ago, which lasted for a few seconds and resolved on it's own. She denies any chest pain at the moment. She admits to shortness of breath when she lays down and states "I feel my baby pushing against my ribs".   She had a headache yesterday which was a 10/10 on pain scale but went down to a 3/10 after taking extra strength tylenol and after taking a nap. She endorses having a mild headache right now and states she took tylenol this morning for relief. She is drinking lots of water, but only had one meal at 11 AM, which was [employer]. She admits to nausea but denies hand swelling or seizures. She has an appointment scheduled with her provider on 07/17/22.   She has also been having braxton hicks contractions x 4 days which she describes as "tight stomach, really bad cramping" which last for a few minutes and usually happen at night time. She also states that she has been having watery stools when she experiences this cramping sensation. Her abdominal cramps worsened last night.   Headache  Associated symptoms include abdominal pain and nausea. Pertinent negatives include no eye  pain, seizures or vomiting.    OB History     Gravida  2   Para  1   Term  1   Preterm      AB      Living  1      SAB      IAB      Ectopic      Multiple  0   Live Births  1           Past Medical History:  Diagnosis Date   Gestational hypertension    Medical history non-contributory     Past Surgical History:  Procedure Laterality Date   gestational hypertension     NO PAST SURGERIES      Family History  Problem Relation Age of Onset   Healthy Mother    Healthy Father     Social History   Tobacco Use   Smoking status: Former    Types: Cigarettes   Smokeless tobacco: Former  Scientific laboratory technician Use: Former   Substances: Flavoring  Substance Use Topics   Alcohol use: Never   Drug use: Never    Allergies: No Known Allergies  Medications Prior to Admission  Medication Sig Dispense Refill Last Dose   acetaminophen (TYLENOL) 500 MG tablet Take 2 tablets (1,000 mg total) by mouth every 6 (six) hours as needed for mild pain, moderate pain, fever or headache (for pain scale < 4). 30 tablet  1 07/15/2022   Prenatal Vit-Fe Fumarate-FA (PREPLUS) 27-1 MG TABS Take 1 tablet by mouth daily. 30 tablet 13 07/15/2022    Review of Systems  Eyes:  Negative for pain and visual disturbance.       Positive for black floater in vision   Cardiovascular:  Negative for chest pain.  Gastrointestinal:  Positive for abdominal pain and nausea. Negative for vomiting.       Positive for watery stools   Genitourinary:  Negative for vaginal bleeding and vaginal discharge.  Neurological:  Positive for headaches. Negative for seizures.   Physical Exam   Blood pressure 129/80, pulse 78, temperature 98.1 F (36.7 C), temperature source Oral, resp. rate 16, SpO2 99 %, not currently breastfeeding.  Physical Exam Vitals and nursing note reviewed.  Constitutional:      Appearance: She is well-developed.  HENT:     Head: Normocephalic.  Cardiovascular:     Rate and  Rhythm: Normal rate.  Pulmonary:     Effort: Pulmonary effort is normal.  Abdominal:     Palpations: Abdomen is soft.  Musculoskeletal:     Cervical back: Normal range of motion.  Neurological:     Mental Status: She is alert.  Psychiatric:        Mood and Affect: Mood normal.        Speech: Speech normal.        Behavior: Behavior normal.     MAU Course  Procedures  MDM 21 y/o F G2P1001 currently [redacted]w[redacted]d gestation presenting for concern of ongoing headaches and new development of black floaters in her vision onset 4 days. Ordered labs to assess for pre eclampsia- CMP showing normal AST and ALT, but elevated Alkaline phosphate. CBC showing low hemoglobin of 8 so will get in touch with office for outpatient transfusion. Normal platelets. UA showing no protein leakage. Creatinine levels 96. Headache resolved after administering fioricet. Vitals reviewed, blood pressure was initially stable, but upon discharge blood pressure elevated at 141/60, therefore scheduled patient for induction on 07/21/2022.  Assessment and Plan  Gestational hypertension in third trimester  -  Advised patient that because of her elevated blood pressure upon discharge, she was scheduled for an induction for 07/21/2022. Patient understands and agrees with plan. -Gave patient MAU return precautions.   Anemia during pregnancy - Encouraged continued hydration and advised patient to eat regularly.  -Will schedule patient for transfusion for near future.   Jean Roberts 07/15/2022, 5:10 PM

## 2022-07-15 NOTE — MAU Provider Note (Signed)
History     CSN: [id number]  Arrival date and time: 07/15/22 1544   Event Date/Time   First Provider Initiated Contact with Patient 07/15/22 1629      Chief Complaint  Patient presents with   Headache   HPI  Steffi Noviello is a 21 y.o. G2P1001 at [redacted]w[redacted]d who presents for evaluation of a headache. Patient reports she has had on and off headaches for a while. She currently rates her headache a 3/10 and took tylenol with some relief this morning. She reports she sees intermittent black spots in her vision but none now. Denies any right upper quadrant pain.   She denies any vaginal bleeding, discharge, and leaking of fluid. Denies any constipation, diarrhea or any urinary complaints. Reports normal fetal movement.   OB History     Gravida  2   Para  1   Term  1   Preterm      AB      Living  1      SAB      IAB      Ectopic      Multiple  0   Live Births  1           Past Medical History:  Diagnosis Date   Gestational hypertension    Medical history non-contributory     Past Surgical History:  Procedure Laterality Date   gestational hypertension     NO PAST SURGERIES      Family History  Problem Relation Age of Onset   Healthy Mother    Healthy Father     Social History   Tobacco Use   Smoking status: Former    Types: Cigarettes   Smokeless tobacco: Former  Building services engineer Use: Former   Substances: Flavoring  Substance Use Topics   Alcohol use: Never   Drug use: Never    Allergies: No Known Allergies  No medications prior to admission.    Review of Systems  Constitutional: Negative.  Negative for fatigue and fever.  HENT: Negative.    Respiratory: Negative.  Negative for shortness of breath.   Cardiovascular: Negative.  Negative for chest pain.  Gastrointestinal: Negative.  Negative for abdominal pain, constipation, diarrhea, nausea and vomiting.  Genitourinary: Negative.  Negative for dysuria, vaginal bleeding and vaginal  discharge.  Neurological:  Positive for headaches. Negative for dizziness.   Physical Exam   Blood pressure (!) 141/60, pulse 98, temperature 98.1 F (36.7 C), temperature source Oral, resp. rate 16, SpO2 99 %, not currently breastfeeding.  Patient Vitals for the past 24 hrs:  BP Temp Temp src Pulse Resp SpO2  07/15/22 1816 (!) 141/60 -- -- 98 -- --  07/15/22 1801 (!) 127/58 -- -- 73 -- --  07/15/22 1746 133/68 -- -- 66 -- --  07/15/22 1731 136/81 -- -- 79 -- --  07/15/22 1716 127/84 -- -- 94 -- --  07/15/22 1701 94/75 -- -- 77 -- --  07/15/22 1700 -- -- -- -- -- 99 %  07/15/22 1646 129/80 -- -- 78 -- --  07/15/22 1645 -- -- -- -- -- 99 %  07/15/22 1631 131/78 -- -- 83 -- --  07/15/22 1628 139/81 98.1 F (36.7 C) Oral 78 16 99 %    Physical Exam Vitals and nursing note reviewed.  Constitutional:      General: She is not in acute distress.    Appearance: She is well-developed.  HENT:     Head: Normocephalic.  Eyes:     Pupils: Pupils are equal, round, and reactive to light.  Cardiovascular:     Rate and Rhythm: Normal rate and regular rhythm.     Heart sounds: Normal heart sounds.  Pulmonary:     Effort: Pulmonary effort is normal. No respiratory distress.     Breath sounds: Normal breath sounds.  Abdominal:     General: Bowel sounds are normal. There is no distension.     Palpations: Abdomen is soft.     Tenderness: There is no abdominal tenderness.  Skin:    General: Skin is warm and dry.  Neurological:     Mental Status: She is alert and oriented to person, place, and time.     Motor: No abnormal muscle tone.     Coordination: Coordination normal.     Deep Tendon Reflexes: Reflexes are normal and symmetric. Reflexes normal.  Psychiatric:        Mood and Affect: Mood normal.        Behavior: Behavior normal.        Thought Content: Thought content normal.        Judgment: Judgment normal.     Fetal Tracing:  Baseline: 130 Variability: moderate Accels:  15x15 Decels: none  Toco: none  MAU Course  Procedures  Results for orders placed or performed during the hospital encounter of 07/15/22 (from the past 24 hour(s))  Urinalysis, Routine w reflex microscopic     Status: Abnormal   Collection Time: 07/15/22  4:49 PM  Result Value Ref Range   Color, Urine YELLOW YELLOW   APPearance CLEAR CLEAR   Specific Gravity, Urine 1.010 1.005 - 1.030   pH 7.0 5.0 - 8.0   Glucose, UA NEGATIVE NEGATIVE mg/dL   Hgb urine dipstick NEGATIVE NEGATIVE   Bilirubin Urine NEGATIVE NEGATIVE   Ketones, ur NEGATIVE NEGATIVE mg/dL   Protein, ur NEGATIVE NEGATIVE mg/dL   Nitrite NEGATIVE NEGATIVE   Leukocytes,Ua SMALL (A) NEGATIVE   RBC / HPF 0-5 0 - 5 RBC/hpf   WBC, UA 0-5 0 - 5 WBC/hpf   Bacteria, UA RARE (A) NONE SEEN   Squamous Epithelial / LPF 0-5 0 - 5   Mucus PRESENT   Protein / creatinine ratio, urine     Status: None   Collection Time: 07/15/22  4:49 PM  Result Value Ref Range   Creatinine, Urine 96 mg/dL   Total Protein, Urine 9 mg/dL   Protein Creatinine Ratio 0.09 0.00 - 0.15 mg/mg[Cre]  CBC     Status: Abnormal   Collection Time: 07/15/22  5:15 PM  Result Value Ref Range   WBC 8.3 4.0 - 10.5 K/uL   RBC 3.92 3.87 - 5.11 MIL/uL   Hemoglobin 8.0 (L) 12.0 - 15.0 g/dL   HCT 25.5 (L) 36.0 - 46.0 %   MCV 65.1 (L) 80.0 - 100.0 fL   MCH 20.4 (L) 26.0 - 34.0 pg   MCHC 31.4 30.0 - 36.0 g/dL   RDW 16.0 (H) 11.5 - 15.5 %   Platelets 212 150 - 400 K/uL   nRBC 0.0 0.0 - 0.2 %  Comprehensive metabolic panel     Status: Abnormal   Collection Time: 07/15/22  5:15 PM  Result Value Ref Range   Sodium 137 135 - 145 mmol/L   Potassium 3.6 3.5 - 5.1 mmol/L   Chloride 105 98 - 111 mmol/L   CO2 20 (L) 22 - 32 mmol/L   Glucose, Bld 72 70 - 99 mg/dL  BUN 8 6 - 20 mg/dL   Creatinine, Ser 1.96 0.44 - 1.00 mg/dL   Calcium 9.0 8.9 - 22.2 mg/dL   Total Protein 5.9 (L) 6.5 - 8.1 g/dL   Albumin 2.6 (L) 3.5 - 5.0 g/dL   AST 21 15 - 41 U/L   ALT 10 0 - 44  U/L   Alkaline Phosphatase 207 (H) 38 - 126 U/L   Total Bilirubin 0.3 0.3 - 1.2 mg/dL   GFR, Estimated >97 >98 mL/min   Anion gap 12 5 - 15     MDM Labs ordered and reviewed.   UA CBC, CMP, Protein/creat ratio Fioricet- patient reports resolution of headache and able to sleep  Patient now meets criteria for gHTN. IOL scheduled for 11/13 AM. Preeclampsia precautions reviewed at length.   Significant anemia. Patient reports she is taking oral iron every other day. Discussed attempting to get her in for iron infusion before IOL. Patient states she will consider.   Assessment and Plan   1. Pregnancy headache in third trimester   2. Anemia during pregnancy   3. [redacted] weeks gestation of pregnancy     -Discharge home in stable condition -Rx for fioricet sent to pharmacy -Preeclampsia precautions discussed -Patient advised to follow-up with OB as scheduled for prenatal care on 11/9 -Patient may return to MAU as needed or if her condition were to change or worsen  Rolm Bookbinder, CNM 07/15/2022, 4:30 PM

## 2022-07-16 ENCOUNTER — Other Ambulatory Visit: Payer: Self-pay | Admitting: Advanced Practice Midwife

## 2022-07-17 ENCOUNTER — Telehealth (HOSPITAL_COMMUNITY): Payer: Self-pay | Admitting: *Deleted

## 2022-07-17 ENCOUNTER — Other Ambulatory Visit: Payer: Self-pay

## 2022-07-17 ENCOUNTER — Other Ambulatory Visit (HOSPITAL_COMMUNITY)
Admission: RE | Admit: 2022-07-17 | Discharge: 2022-07-17 | Disposition: A | Discharge status: Home / Self Care | Payer: Medicaid Other | Source: Ambulatory Visit | Attending: Student | Admitting: Student

## 2022-07-17 ENCOUNTER — Encounter (HOSPITAL_COMMUNITY): Payer: Self-pay | Admitting: *Deleted

## 2022-07-17 ENCOUNTER — Ambulatory Visit (INDEPENDENT_AMBULATORY_CARE_PROVIDER_SITE_OTHER): Payer: Medicaid Other | Admitting: Student

## 2022-07-17 VITALS — BP 145/85 | HR 93 | Wt 160.0 lb

## 2022-07-17 DIAGNOSIS — Z3493 Encounter for supervision of normal pregnancy, unspecified, third trimester: Secondary | ICD-10-CM

## 2022-07-17 DIAGNOSIS — Z3A36 36 weeks gestation of pregnancy: Secondary | ICD-10-CM | POA: Insufficient documentation

## 2022-07-17 DIAGNOSIS — Z369 Encounter for antenatal screening, unspecified: Secondary | ICD-10-CM | POA: Insufficient documentation

## 2022-07-17 DIAGNOSIS — O133 Gestational [pregnancy-induced] hypertension without significant proteinuria, third trimester: Secondary | ICD-10-CM

## 2022-07-17 NOTE — Progress Notes (Signed)
   PRENATAL VISIT NOTE  Subjective:  Jean Roberts is a 21 y.o. G2P1001 at [redacted]w[redacted]d being seen today for ongoing prenatal care.  She is currently monitored for the following issues for this high-risk pregnancy and has Adjustment disorder with mixed anxiety and depressed mood; Unsuccessful suicide attempt [facility]); Intentional drug overdose ([facility]); Patient needs psychiatric hold for evaluation; Supervision of high risk pregnancy, antepartum; History of gestational hypertension; History of drug overdose; Elevated blood pressure affecting pregnancy, antepartum; and Gestational hypertension on their problem list.  Patient reports no complaints.  Contractions: Irritability. Vag. Bleeding: None.  Movement: Present. Denies leaking of fluid.   The following portions of the patient's history were reviewed and updated as appropriate: allergies, current medications, past family history, past medical history, past social history, past surgical history and problem list.   Objective:   Vitals:   07/17/22 1439  BP: (!) 145/85  Pulse: 93  Weight: 160 lb (72.6 kg)    Fetal Status: Fetal Heart Rate (bpm): 140   Movement: Present  Presentation: Vertex  General:  Alert, oriented and cooperative. Patient is in no acute distress.  Skin: Skin is warm and dry. No rash noted.   Cardiovascular: Normal heart rate noted  Respiratory: Normal respiratory effort, no problems with respiration noted  Abdomen: Soft, gravid, appropriate for gestational age.  Pain/Pressure: Present     Pelvic: Cervical exam deferred        Extremities: Normal range of motion.  Edema: Trace  Mental Status: Normal mood and affect. Normal behavior. Normal judgment and thought content.   Assessment and Plan:  Pregnancy: G2P1001 at [redacted]w[redacted]d 1. Encounter for supervision of low-risk pregnancy in third trimester - Excited for delivery!  - Frequent and vigorous fetal movement  2. [redacted] weeks gestation of pregnancy - Cervicovaginal ancillary only( [facility]) - Culture, beta strep (group b only)  3. Gestational hypertension, third trimester -IOL scheduled 11/13 - Patient aware  of S/S associated with Pre-Ex and worsening blood pressures requiring immediate medical attention   Preterm labor symptoms and general obstetric precautions including but not limited to vaginal bleeding, contractions, leaking of fluid and fetal movement were reviewed in detail with the patient. Please refer to After Visit Summary for other counseling recommendations.   No follow-ups on file.  Future Appointments  Date Time Provider Department Center  07/21/2022  7:15 AM [facility] SCHED ROOM [facility] None  07/23/2022  4:15 PM Bernerd Limbo, CNM [facility]  07/30/2022  3:15 PM Osborne Oman [facility]  08/06/2022  3:35 PM Osborne Oman [facility]  08/13/2022  2:15 PM [facility]  08/13/2022  3:15 PM Bernerd Limbo, CNM [facility]    Corlis Hove, NP

## 2022-07-17 NOTE — Telephone Encounter (Signed)
Preadmission screenPreadmission screen 

## 2022-07-18 LAB — CERVICOVAGINAL ANCILLARY ONLY
Bacterial Vaginitis (gardnerella): POSITIVE — AB
Candida Glabrata: NEGATIVE
Candida Vaginitis: NEGATIVE
Chlamydia: POSITIVE — AB
Comment: NEGATIVE
Comment: NEGATIVE
Comment: NEGATIVE
Comment: NEGATIVE
Comment: NEGATIVE
Comment: NORMAL
Neisseria Gonorrhea: NEGATIVE
Trichomonas: NEGATIVE

## 2022-07-20 LAB — CULTURE, BETA STREP (GROUP B ONLY): Strep Gp B Culture: POSITIVE — AB

## 2022-07-21 ENCOUNTER — Encounter: Payer: Self-pay | Admitting: *Deleted

## 2022-07-21 ENCOUNTER — Inpatient Hospital Stay (HOSPITAL_COMMUNITY): Payer: Medicaid Other

## 2022-07-21 ENCOUNTER — Encounter (HOSPITAL_COMMUNITY): Payer: Self-pay | Admitting: Obstetrics and Gynecology

## 2022-07-21 ENCOUNTER — Inpatient Hospital Stay (HOSPITAL_COMMUNITY)
Admission: AD | Admit: 2022-07-21 | Discharge: 2022-07-24 | DRG: 807 | Disposition: A | Discharge status: Home / Self Care | Payer: Medicaid Other | Attending: Obstetrics and Gynecology | Admitting: Obstetrics and Gynecology

## 2022-07-21 DIAGNOSIS — O99820 Streptococcus B carrier state complicating pregnancy: Secondary | ICD-10-CM | POA: Diagnosis not present

## 2022-07-21 DIAGNOSIS — O99824 Streptococcus B carrier state complicating childbirth: Secondary | ICD-10-CM | POA: Diagnosis present

## 2022-07-21 DIAGNOSIS — F4323 Adjustment disorder with mixed anxiety and depressed mood: Secondary | ICD-10-CM | POA: Diagnosis present

## 2022-07-21 DIAGNOSIS — Z87891 Personal history of nicotine dependence: Secondary | ICD-10-CM | POA: Diagnosis not present

## 2022-07-21 DIAGNOSIS — O99344 Other mental disorders complicating childbirth: Secondary | ICD-10-CM | POA: Diagnosis present

## 2022-07-21 DIAGNOSIS — O0990 Supervision of high risk pregnancy, unspecified, unspecified trimester: Secondary | ICD-10-CM

## 2022-07-21 DIAGNOSIS — O134 Gestational [pregnancy-induced] hypertension without significant proteinuria, complicating childbirth: Principal | ICD-10-CM | POA: Diagnosis present

## 2022-07-21 DIAGNOSIS — O139 Gestational [pregnancy-induced] hypertension without significant proteinuria, unspecified trimester: Secondary | ICD-10-CM | POA: Diagnosis present

## 2022-07-21 DIAGNOSIS — Z9151 Personal history of suicidal behavior: Secondary | ICD-10-CM | POA: Diagnosis not present

## 2022-07-21 DIAGNOSIS — Z3A37 37 weeks gestation of pregnancy: Secondary | ICD-10-CM | POA: Diagnosis not present

## 2022-07-21 DIAGNOSIS — X838XXA Intentional self-harm by other specified means, initial encounter: Secondary | ICD-10-CM | POA: Diagnosis present

## 2022-07-21 DIAGNOSIS — O9902 Anemia complicating childbirth: Secondary | ICD-10-CM | POA: Diagnosis present

## 2022-07-21 LAB — CBC
HCT: 26.9 % — ABNORMAL LOW (ref 36.0–46.0)
HCT: 27.6 % — ABNORMAL LOW (ref 36.0–46.0)
HCT: 26.8 % — ABNORMAL LOW (ref 36.0–46.0)
Hemoglobin: 8.3 g/dL — ABNORMAL LOW (ref 12.0–15.0)
Hemoglobin: 8.7 g/dL — ABNORMAL LOW (ref 12.0–15.0)
Hemoglobin: 8.3 g/dL — ABNORMAL LOW (ref 12.0–15.0)
MCH: 20.1 pg — ABNORMAL LOW (ref 26.0–34.0)
MCH: 20.1 pg — ABNORMAL LOW (ref 26.0–34.0)
MCH: 20 pg — ABNORMAL LOW (ref 26.0–34.0)
MCHC: 30.9 g/dL (ref 30.0–36.0)
MCHC: 31.5 g/dL (ref 30.0–36.0)
MCHC: 31 g/dL (ref 30.0–36.0)
MCV: 65.1 fL — ABNORMAL LOW (ref 80.0–100.0)
MCV: 63.9 fL — ABNORMAL LOW (ref 80.0–100.0)
MCV: 64.7 fL — ABNORMAL LOW (ref 80.0–100.0)
Platelets: 220 10*3/uL (ref 150–400)
Platelets: UNDETERMINED 10*3/uL (ref 150–400)
Platelets: 219 10*3/uL (ref 150–400)
RBC: 4.13 MIL/uL (ref 3.87–5.11)
RBC: 4.32 MIL/uL (ref 3.87–5.11)
RBC: 4.14 MIL/uL (ref 3.87–5.11)
RDW: 15.9 % — ABNORMAL HIGH (ref 11.5–15.5)
RDW: 16 % — ABNORMAL HIGH (ref 11.5–15.5)
RDW: 15.9 % — ABNORMAL HIGH (ref 11.5–15.5)
WBC: 8.5 10*3/uL (ref 4.0–10.5)
WBC: 11.2 10*3/uL — ABNORMAL HIGH (ref 4.0–10.5)
WBC: 12.2 10*3/uL — ABNORMAL HIGH (ref 4.0–10.5)
nRBC: 0 % (ref 0.0–0.2)
nRBC: 0 % (ref 0.0–0.2)
nRBC: 0 % (ref 0.0–0.2)

## 2022-07-21 LAB — COMPREHENSIVE METABOLIC PANEL
ALT: 9 U/L (ref 0–44)
AST: 19 U/L (ref 15–41)
Albumin: 2.6 g/dL — ABNORMAL LOW (ref 3.5–5.0)
Alkaline Phosphatase: 225 U/L — ABNORMAL HIGH (ref 38–126)
Anion gap: 11 (ref 5–15)
BUN: 6 mg/dL (ref 6–20)
CO2: 20 mmol/L — ABNORMAL LOW (ref 22–32)
Calcium: 8.9 mg/dL (ref 8.9–10.3)
Chloride: 105 mmol/L (ref 98–111)
Creatinine, Ser: 0.78 mg/dL (ref 0.44–1.00)
GFR, Estimated: >60 mL/min (ref >60)
Glucose, Bld: 98 mg/dL (ref 70–99)
Potassium: 3.4 mmol/L — ABNORMAL LOW (ref 3.5–5.1)
Sodium: 136 mmol/L (ref 135–145)
Total Bilirubin: 0.3 mg/dL (ref 0.3–1.2)
Total Protein: 6.1 g/dL — ABNORMAL LOW (ref 6.5–8.1)

## 2022-07-21 LAB — TYPE AND SCREEN
ABO/RH(D): A POS
Antibody Screen: NEGATIVE

## 2022-07-21 LAB — RPR: RPR Ser Ql: NONREACTIVE

## 2022-07-21 LAB — PROTEIN / CREATININE RATIO, URINE
Creatinine, Urine: 77 mg/dL
Protein Creatinine Ratio: 0.1 mg/mg{Cre} (ref 0.00–0.15)
Total Protein, Urine: 8 mg/dL

## 2022-07-21 MED ORDER — SODIUM CHLORIDE 0.9 % IV SOLN
5.0000 10*6.[IU] | Freq: Once | INTRAVENOUS | Status: AC
  Administered 2022-07-21: 5 10*6.[IU] via INTRAVENOUS
  Filled 2022-07-21: qty 5

## 2022-07-21 MED ORDER — TERBUTALINE SULFATE 1 MG/ML IJ SOLN: 0.2500 mg | Freq: Once | INTRAMUSCULAR | Status: DC | PRN

## 2022-07-21 MED ORDER — SOD CITRATE-CITRIC ACID 500-334 MG/5ML PO SOLN: 30.0000 mL | ORAL | Status: DC | PRN

## 2022-07-21 MED ORDER — LACTATED RINGERS IV SOLN: 500.0000 mL | Freq: Once | INTRAVENOUS | Status: DC

## 2022-07-21 MED ORDER — ONDANSETRON HCL 4 MG/2ML IJ SOLN: 4.0000 mg | Freq: Four times a day (QID) | INTRAMUSCULAR | Status: DC | PRN

## 2022-07-21 MED ORDER — DIPHENHYDRAMINE HCL 50 MG/ML IJ SOLN: 12.5000 mg | INTRAMUSCULAR | Status: DC | PRN

## 2022-07-21 MED ORDER — MISOPROSTOL 25 MCG QUARTER TABLET
25.0000 ug | ORAL_TABLET | Freq: Once | ORAL | Status: AC
  Administered 2022-07-21: 25 ug via VAGINAL
  Filled 2022-07-21: qty 1

## 2022-07-21 MED ORDER — EPHEDRINE 5 MG/ML INJ: 10.0000 mg | INTRAVENOUS | Status: DC | PRN

## 2022-07-21 MED ORDER — PHENYLEPHRINE 80 MCG/ML (10ML) SYRINGE FOR IV PUSH (FOR BLOOD PRESSURE SUPPORT): 80.0000 ug | PREFILLED_SYRINGE | INTRAVENOUS | Status: DC | PRN

## 2022-07-21 MED ORDER — LIDOCAINE HCL (PF) 1 % IJ SOLN
30.0000 mL | INTRAMUSCULAR | Status: AC | PRN
  Administered 2022-07-22: 30 mL via SUBCUTANEOUS
  Filled 2022-07-21: qty 30

## 2022-07-21 MED ORDER — OXYTOCIN-SODIUM CHLORIDE 30-0.9 UT/500ML-% IV SOLN
2.5000 [IU]/h | INTRAVENOUS | Status: DC
  Filled 2022-07-21: qty 500

## 2022-07-21 MED ORDER — PENICILLIN G POT IN DEXTROSE 60000 UNIT/ML IV SOLN
3.0000 10*6.[IU] | INTRAVENOUS | Status: DC
  Administered 2022-07-21 (×3): 3 10*6.[IU] via INTRAVENOUS
  Filled 2022-07-21 (×3): qty 50

## 2022-07-21 MED ORDER — OXYCODONE-ACETAMINOPHEN 5-325 MG PO TABS: 1.0000 | ORAL_TABLET | ORAL | Status: DC | PRN

## 2022-07-21 MED ORDER — LACTATED RINGERS IV SOLN: INTRAVENOUS | Status: DC

## 2022-07-21 MED ORDER — ACETAMINOPHEN 325 MG PO TABS: 650.0000 mg | ORAL_TABLET | ORAL | Status: DC | PRN

## 2022-07-21 MED ORDER — OXYTOCIN-SODIUM CHLORIDE 30-0.9 UT/500ML-% IV SOLN
1.0000 m[IU]/min | INTRAVENOUS | Status: DC
  Administered 2022-07-21: 2 m[IU]/min via INTRAVENOUS
  Filled 2022-07-21: qty 500

## 2022-07-21 MED ORDER — OXYCODONE-ACETAMINOPHEN 5-325 MG PO TABS: 2.0000 | ORAL_TABLET | ORAL | Status: DC | PRN

## 2022-07-21 MED ORDER — MISOPROSTOL 50MCG HALF TABLET
50.0000 ug | ORAL_TABLET | Freq: Once | ORAL | Status: AC
  Administered 2022-07-21: 50 ug via ORAL
  Filled 2022-07-21: qty 1

## 2022-07-21 MED ORDER — FLEET ENEMA 7-19 GM/118ML RE ENEM: 1.0000 | ENEMA | RECTAL | Status: DC | PRN

## 2022-07-21 MED ORDER — FENTANYL CITRATE (PF) 100 MCG/2ML IJ SOLN
100.0000 ug | INTRAMUSCULAR | Status: DC | PRN
  Administered 2022-07-21 (×2): 100 ug via INTRAVENOUS
  Filled 2022-07-21: qty 2

## 2022-07-21 MED ORDER — LACTATED RINGERS IV SOLN: 500.0000 mL | INTRAVENOUS | Status: DC | PRN

## 2022-07-21 MED ORDER — OXYTOCIN BOLUS FROM INFUSION
333.0000 mL | Freq: Once | INTRAVENOUS | Status: AC
  Administered 2022-07-22: 333 mL via INTRAVENOUS

## 2022-07-21 MED ORDER — FENTANYL-BUPIVACAINE-NACL 0.5-0.125-0.9 MG/250ML-% EP SOLN
12.0000 mL/h | EPIDURAL | Status: DC | PRN
  Administered 2022-07-22: 12 mL/h via EPIDURAL
  Filled 2022-07-21: qty 250

## 2022-07-21 MED ORDER — FENTANYL CITRATE (PF) 100 MCG/2ML IJ SOLN
INTRAMUSCULAR | Status: AC
  Filled 2022-07-21: qty 2

## 2022-07-21 MED ORDER — AZITHROMYCIN 500 MG PO TABS
1000.0000 mg | ORAL_TABLET | Freq: Every day | ORAL | Status: DC
  Administered 2022-07-21: 1000 mg via ORAL
  Filled 2022-07-21: qty 2

## 2022-07-21 NOTE — Progress Notes (Signed)
Jean Roberts is a 21 y.o. G2P1001 at [redacted]w[redacted]d by admitted for IOL for gHTN.   Subjective: Pt was feeling painful contractions. Now s/p fentanyl and feeling improvement in pain. Still planning for epidural.   Objective: BP (!) 150/90   Pulse 88   Temp 97.8 F (36.6 C) (Oral)   Resp 16   Ht 5\' 4"  (1.626 m)   Wt 72.6 kg   LMP  (LMP Unknown)   BMI 27.46 kg/m  No intake/output data recorded. No intake/output data recorded.  FHT:  FHR: 123 bpm, variability: moderate,  accelerations:  Abscent,  decelerations:  Absent UC:   regular, every 1-3 minutes  SVE:   Dilation: 3 Effacement (%): 50 Station: -3 Exam by:: Dr. [email or web address]  Labs: Lab Results  Component Value Date   WBC 11.2 (H) 07/21/2022   HGB 8.7 (L) 07/21/2022   HCT 27.6 (L) 07/21/2022   MCV 63.9 (L) 07/21/2022   PLT PLATELET CLUMPS NOTED ON SMEAR, UNABLE TO ESTIMATE 07/21/2022   Assessment / Plan: IOL for gHTN  Labor: Making adequate cervical change after Cytotec and FB. SVE 3/50/-3. Will plan to start pitocin now. Discussed AROM when able pt agreeable.  gHTN: asymptomatic, mild range pressures, continue to monitor Fetal Wellbeing:  Category I Pain Control:  Epidural I/D:   GBS and Chlamydia Anticipated MOD:  NSVD  07/23/2022, MD PGY3 Family Medicine Resident 07/21/2022, 7:40 PM

## 2022-07-21 NOTE — Progress Notes (Signed)
Received STD report from weekend and noted Gurtha + for Chlamydia and she is in hospital now for IOL for GHTN. I sent a message to providers Dr Gregary Cromer and Dr. Nobie Putnam to notify them. They verfied they will treat her in hospital during IOL.  STD form for [facility] completed.  Nancy Fetter

## 2022-07-21 NOTE — H&P (Addendum)
OBSTETRIC ADMISSION HISTORY AND PHYSICAL  Jean Roberts is a 21 y.o. female G2P1001 with IUP at [redacted]w[redacted]d by 7wUS presenting for IOL for gHTN. She reports +FMs, No LOF, no VB, no blurry vision, headaches or peripheral edema, and RUQ pain.  She plans on breast and bottle feeding. She request depo for birth control. She received her prenatal care at  [facility]    Dating: By [place] --->  Estimated Date of Delivery: 08/11/22  Sono:   @[redacted]w[redacted]d , CWD, normal anatomy, breech presentation, posterior placenta, 629g, 32% EFW    Prenatal History/Complications:  -gHTN -H/o suicide attempt by intentional drug overdose (presented to the ED after intentionally drinking 2/3 of a bottle of Robitussin with intent to kill herself. Was evaluated by psychiatry who did not recommend inpatient hospitalization as SI had reportedly resolved) and has been following with BH.  -H/o anxiety/depression currently not on medications -Migraines -Chlamydia: tested positive 8/29 tx with Azithro, neg TOC 9/25. Again tested positive 11/9. Plan to tx today with newborn notification.   Past Medical History: Past Medical History:  Diagnosis Date   Gestational hypertension    Medical history non-contributory    Past Surgical History: Past Surgical History:  Procedure Laterality Date   gestational hypertension     NO PAST SURGERIES     Obstetrical History: OB History     Gravida  2   Para  1   Term  1   Preterm      AB      Living  1      SAB      IAB      Ectopic      Multiple  0   Live Births  1          Social History Social History   Socioeconomic History   Marital status: Single    Spouse name: Not on file   Number of children: Not on file   Years of education: Not on file   Highest education level: Not on file  Occupational History   Not on file  Tobacco Use   Smoking status: Former    Types: Cigarettes   Smokeless tobacco: Former  13/9 Use: Former   Substances: Flavoring   Substance and Sexual Activity   Alcohol use: Never   Drug use: Never   Sexual activity: Not Currently  Other Topics Concern   Not on file  Social History Narrative   Not on file   Social Determinants of Health   Financial Resource Strain: Not on file  Food Insecurity: Food Insecurity Present (06/02/2022)   Hunger Vital Sign    Worried About Running Out of Food in the Last Year: Sometimes true    Ran Out of Food in the Last Year: Sometimes true  Transportation Needs: No Transportation Needs (06/02/2022)   PRAPARE - 06/04/2022 (Medical): No    Lack of Transportation (Non-Medical): No  Physical Activity: Not on file  Stress: Not on file  Social Connections: Not on file   Family History: Family History  Problem Relation Age of Onset   Healthy Mother    Healthy Father    Allergies: No Known Allergies  Medications Prior to Admission  Medication Sig Dispense Refill Last Dose   acetaminophen (TYLENOL) 500 MG tablet Take 2 tablets (1,000 mg total) by mouth every 6 (six) hours as needed for mild pain, moderate pain, fever or headache (for pain scale < 4). 30 tablet  1    butalbital-acetaminophen-caffeine (FIORICET) 50-325-40 MG tablet Take 1-2 tablets by mouth every 6 (six) hours as needed for headache. 20 tablet 0    Prenatal Vit-Fe Fumarate-FA (PREPLUS) 27-1 MG TABS Take 1 tablet by mouth daily. 30 tablet 13    Review of Systems   All systems reviewed and negative except as stated in HPI  Height 5\' 4"  (1.626 m), weight 72.6 kg, not currently breastfeeding. General appearance: alert, cooperative, appears stated age, and no distress Lungs: clear to auscultation bilaterally Heart: regular rate and rhythm Abdomen: soft, non-tender; bowel sounds normal Pelvic: adequate Extremities: Homans sign is negative, no sign of DVT Presentation: cephalic Fetal monitoringBaseline: 120 bpm, Variability: moderate, Accelerations: present, and Decelerations:  Absent Uterine activityFrequency: Every 7-10 minutes   Prenatal labs: ABO, Rh: A/Positive/-- (06/19 0909) Antibody: Negative (06/19 0909) Rubella: 1.52 (06/19 0909) RPR: Non Reactive (09/12 0924)  HBsAg: Negative (06/19 0909)  HIV: Non Reactive (09/12 0924)  GBS: Positive/-- (11/09 1523)  1 hr Glucola negative 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  Patient Active Problem List   Diagnosis Date Noted   Gestational hypertension 07/15/2022   Elevated blood pressure affecting pregnancy, antepartum 07/03/2022   History of gestational hypertension 04/28/2022   History of drug overdose 04/28/2022   Supervision of high risk pregnancy, antepartum 01/22/2022   Adjustment disorder with mixed anxiety and depressed mood 01/06/2022   Unsuccessful suicide attempt (HCC) 01/06/2022   Intentional drug overdose [facility])    Patient needs psychiatric hold for evaluation     Assessment/Plan:  Jean Roberts is a 21 y.o. G2P1001 at [redacted]w[redacted]d here for IOL for gHTN.   #IOL: SVE closed/thick/high. Plan for [redacted]w[redacted]d Cytotec vaginally and 50mg  Cytotec PO. Recheck in 4hrs.  #gHTN: repeat CMP/UPC at admit. Pt asymptomatic. Normotensive here. Continue to monitor closely. #Chlamydia in Pregnancy: positive 11/9. Plan for PO 1000mg  once today. Will notify newborn service and strongly encourage erythromycin eye ointment for prophylaxis. Discussed other risks to newborn including pulmonary infection.   #H/o Suicide Attempt this pregnancy: no SI/HI today. Mood stable. Plan for close [facility] follow up as pt at high risk for pp depression/psychosis.  #Pain: Epidural #FWB: Cat I #ID:  GBS Pos: PCN ordered #MOF: Both breast/bottle #MOC:Depo  #Circ:  Yes if female   13/9, MD  PGY3 Family Medicine Resident  07/21/2022, 9:12 AM   Fellow Attestation  I saw and evaluated the patient, performing the key elements of the service.I  personally performed or re-performed the history, physical exam, and medical decision making activities of this service and have verified that the service and findings are accurately documented in the resident's note. I developed the management plan that is described in the resident's note, and I agree with the content, with my edits above.    [facility], MD OB Fellow

## 2022-07-21 NOTE — Progress Notes (Signed)
Miasha Emmons is a 21 y.o. G2P1001 at [redacted]w[redacted]d admitted for induction of labor due to gestational Hypertension.  Subjective: Pt intermittently feeling her contractions. No urge to push.   Objective: BP 132/85   Pulse (!) 119   Temp 98 F (36.7 C) (Oral)   Resp 16   Ht 5\' 4"  (1.626 m)   Wt 72.6 kg   LMP  (LMP Unknown)   BMI 27.46 kg/m  No intake/output data recorded. No intake/output data recorded.  FHT:  FHR: 130 bpm, variability: moderate,  accelerations:  Present,  decelerations:  Absent Cat I UC:   regular, every 1-4 minutes  SVE:   Dilation: 1 Effacement (%): Thick Exam by:: Dr [email or web address]  Labs: Lab Results  Component Value Date   WBC 8.5 07/21/2022   HGB 8.3 (L) 07/21/2022   HCT 26.9 (L) 07/21/2022   MCV 65.1 (L) 07/21/2022   PLT 220 07/21/2022    Assessment / Plan: IOL due to gHTN.  IOL: SVE 1/30/-3. FB placed with sterile speculum. Plan for another 07-03-1995 of Cytotec PO.  Anemia: plan for PPD1 hgb gHTN: pt asymptomatic. Labs wnl. Normotensive pressures. Continue to monitor.  Fetal Wellbeing:  Category I Pain Control:   eventual epidural I/D:   GBS and chlamydia positive. PCN ongoing, s/p 1g Azithro Anticipated MOD:  NSVD  , MD PGY3 Family Medicine Resident 07/21/2022, 2:35 PM

## 2022-07-22 ENCOUNTER — Encounter (HOSPITAL_COMMUNITY): Payer: Self-pay | Admitting: Obstetrics and Gynecology

## 2022-07-22 ENCOUNTER — Inpatient Hospital Stay (HOSPITAL_COMMUNITY): Payer: Medicaid Other | Admitting: Anesthesiology

## 2022-07-22 DIAGNOSIS — O134 Gestational [pregnancy-induced] hypertension without significant proteinuria, complicating childbirth: Secondary | ICD-10-CM

## 2022-07-22 DIAGNOSIS — O99820 Streptococcus B carrier state complicating pregnancy: Secondary | ICD-10-CM

## 2022-07-22 DIAGNOSIS — Z3A37 37 weeks gestation of pregnancy: Secondary | ICD-10-CM

## 2022-07-22 LAB — CBC
HCT: 25.4 % — ABNORMAL LOW (ref 36.0–46.0)
Hemoglobin: 8 g/dL — ABNORMAL LOW (ref 12.0–15.0)
MCH: 20 pg — ABNORMAL LOW (ref 26.0–34.0)
MCHC: 31.5 g/dL (ref 30.0–36.0)
MCV: 63.5 fL — ABNORMAL LOW (ref 80.0–100.0)
Platelets: 195 10*3/uL (ref 150–400)
RBC: 4 MIL/uL (ref 3.87–5.11)
RDW: 15.7 % — ABNORMAL HIGH (ref 11.5–15.5)
WBC: 13.1 10*3/uL — ABNORMAL HIGH (ref 4.0–10.5)
nRBC: 0 % (ref 0.0–0.2)

## 2022-07-22 MED ORDER — ONDANSETRON HCL 4 MG PO TABS
4.0000 mg | ORAL_TABLET | ORAL | Status: DC | PRN
  Administered 2022-07-22: 4 mg via ORAL
  Filled 2022-07-22: qty 1

## 2022-07-22 MED ORDER — BENZOCAINE-MENTHOL 20-0.5 % EX AERO
1.0000 | INHALATION_SPRAY | CUTANEOUS | Status: DC | PRN
  Administered 2022-07-22 – 2022-07-24 (×2): 1 via TOPICAL
  Filled 2022-07-22 (×3): qty 56

## 2022-07-22 MED ORDER — IBUPROFEN 600 MG PO TABS
600.0000 mg | ORAL_TABLET | Freq: Four times a day (QID) | ORAL | Status: DC
  Administered 2022-07-22 – 2022-07-24 (×7): 600 mg via ORAL
  Filled 2022-07-22 (×8): qty 1

## 2022-07-22 MED ORDER — LIDOCAINE HCL (PF) 1 % IJ SOLN
INTRAMUSCULAR | Status: DC | PRN
  Administered 2022-07-22: 5 mL via EPIDURAL

## 2022-07-22 MED ORDER — ZOLPIDEM TARTRATE 5 MG PO TABS: 5.0000 mg | ORAL_TABLET | Freq: Every evening | ORAL | Status: DC | PRN

## 2022-07-22 MED ORDER — FUROSEMIDE 20 MG PO TABS
20.0000 mg | ORAL_TABLET | Freq: Every day | ORAL | Status: DC
  Administered 2022-07-22 – 2022-07-24 (×3): 20 mg via ORAL
  Filled 2022-07-22 (×3): qty 1

## 2022-07-22 MED ORDER — SIMETHICONE 80 MG PO CHEW: 80.0000 mg | CHEWABLE_TABLET | ORAL | Status: DC | PRN

## 2022-07-22 MED ORDER — WITCH HAZEL-GLYCERIN EX PADS: 1.0000 | MEDICATED_PAD | CUTANEOUS | Status: DC | PRN

## 2022-07-22 MED ORDER — LIDOCAINE-EPINEPHRINE (PF) 1.5 %-1:200000 IJ SOLN
INTRAMUSCULAR | Status: DC | PRN
  Administered 2022-07-22: 5 mL via EPIDURAL

## 2022-07-22 MED ORDER — SENNOSIDES-DOCUSATE SODIUM 8.6-50 MG PO TABS
2.0000 | ORAL_TABLET | Freq: Every day | ORAL | Status: DC
  Administered 2022-07-23: 2 via ORAL
  Filled 2022-07-22 (×2): qty 2

## 2022-07-22 MED ORDER — ONDANSETRON HCL 4 MG/2ML IJ SOLN: 4.0000 mg | INTRAMUSCULAR | Status: DC | PRN

## 2022-07-22 MED ORDER — ACETAMINOPHEN 325 MG PO TABS: 650.0000 mg | ORAL_TABLET | ORAL | Status: DC | PRN

## 2022-07-22 MED ORDER — DIPHENHYDRAMINE HCL 25 MG PO CAPS: 25.0000 mg | ORAL_CAPSULE | Freq: Four times a day (QID) | ORAL | Status: DC | PRN

## 2022-07-22 MED ORDER — DIBUCAINE (PERIANAL) 1 % EX OINT: 1.0000 | TOPICAL_OINTMENT | CUTANEOUS | Status: DC | PRN

## 2022-07-22 MED ORDER — TETANUS-DIPHTH-ACELL PERTUSSIS 5-2.5-18.5 LF-MCG/0.5 IM SUSY: 0.5000 mL | PREFILLED_SYRINGE | Freq: Once | INTRAMUSCULAR | Status: DC

## 2022-07-22 MED ORDER — PRENATAL MULTIVITAMIN CH
1.0000 | ORAL_TABLET | Freq: Every day | ORAL | Status: DC
  Administered 2022-07-22 – 2022-07-24 (×3): 1 via ORAL
  Filled 2022-07-22 (×3): qty 1

## 2022-07-22 MED ORDER — NIFEDIPINE ER OSMOTIC RELEASE 30 MG PO TB24
30.0000 mg | ORAL_TABLET | Freq: Every day | ORAL | Status: DC
  Administered 2022-07-22 – 2022-07-24 (×3): 30 mg via ORAL
  Filled 2022-07-22 (×3): qty 1

## 2022-07-22 MED ORDER — COCONUT OIL OIL: 1.0000 | TOPICAL_OIL | Status: DC | PRN

## 2022-07-22 NOTE — Lactation Note (Signed)
This note was copied from a baby's chart. Lactation Consultation Note  Patient Name: Jean Roberts [id number] Date: 07/22/2022 Reason for consult: L&D Initial assessment;Early term 37-38.6wks Age:19 hours Assisted baby to the breast. Baby cueing and latched well. Popped off at first several times then stayed latched BF well. Mom denied painful latch. Encouraged STS during BF. Mom will be seen on MBU. Got family member to sit beside of mom d/t mom is very tired and sleepy.  Maternal Data Does the patient have breastfeeding experience prior to this delivery?: Yes How long did the patient breastfeed?: 1 1/2 months to her now 47 month old  Feeding    LATCH Score Latch: Grasps breast easily, tongue down, lips flanged, rhythmical sucking.  Audible Swallowing: None  Type of Nipple: Everted at rest and after stimulation  Comfort (Breast/Nipple): Soft / non-tender  Hold (Positioning): Assistance needed to correctly position infant at breast and maintain latch.  LATCH Score: 7   Lactation Tools Discussed/Used    Interventions Interventions: Adjust position;Assisted with latch;Support pillows;Skin to skin;Breast compression  Discharge    Consult Status Consult Status: Follow-up from L&D Date: 07/22/22 Follow-up type: In-patient    Charyl Dancer 07/22/2022, 2:47 AM

## 2022-07-22 NOTE — Discharge Summary (Signed)
Postpartum Discharge Summary  Date of Service updated***     Patient Name: Jean Roberts DOB: 26-Mar-2001 MRN: [id number]  Date of admission: 07/21/2022 Delivery date:07/22/2022  Delivering provider: Christin Fudge  Date of discharge: 07/22/2022  Admitting diagnosis: Gestational hypertension [O13.9] Intrauterine pregnancy: [redacted]w[redacted]d    Secondary diagnosis:  Active Problems:   Spontaneous vaginal delivery Anemia of pregnancy Additional problems: chlamydia, treated inpt    Discharge diagnosis: Term Pregnancy Delivered and Gestational Hypertension                                              Post partum procedures:{Postpartum procedures:23558} Augmentation: AROM, Pitocin, Cytotec, and IP Foley Complications: None  Hospital course: Induction of Labor With Vaginal Delivery   21y.o. yo G2P1001 at 364w1das4w1das admitted to the hospital 07/21/2022 for induction of labor.  Indication for induction: Gestational hypertension.  Patient had an labor course complicated bynothing Membrane Rupture Time/Date: 12:21 AM ,07/22/2022   Delivery Method:Vaginal, Spontaneous  Episiotomy: None  Lacerations:  2nd degree  Details of delivery can be found in separate delivery note.  Patient had a postpartum course complicated by***. Patient is discharged home 07/22/22.  Newborn Data: Birth date:07/22/2022  Birth time:1:47 AM  Gender:Female  Living status:Living  Apgars:8 ,9  Weight:   Magnesium Sulfate received: No BMZ received: No Rhophylac:N/A MMR:N/A T-DaP:Given prenatally Flu: {F{BTD:17616}ransfusion:{Transfusion received:30440034}  Physical exam  Vitals:   07/22/22 0115 07/22/22 0120 07/22/22 0125 07/22/22 0135  BP: (!) 153/95 (!) 149/86 (!) 144/80 132/71  Pulse: 92 91 (!) 105 91  Resp:      Temp:      TempSrc:      SpO2: 100% 99% 100% 99%  Weight:      Height:       General: {Exam; general:21111117} Lochia: {Desc; appropriate/inappropriate:30686::"appropriate"} Uterine  Fundus: {Desc; firm/soft:30687} Incision: {Exam; incision:21111123} DVT Evaluation: {Exam; dvt:2111122} Labs: Lab Results  Component Value Date   WBC 12.2 (H) 07/21/2022   HGB 8.3 (L) 07/21/2022   HCT 26.8 (L) 07/21/2022   MCV 64.7 (L) 07/21/2022   PLT 219 07/21/2022      Latest Ref Rng & Units 07/21/2022    9:57 AM  CMP  Glucose 70 - 99 mg/dL 98   BUN 6 - 20 mg/dL 6   Creatinine 0.44 - 1.00 mg/dL 0.78   Sodium 135 - 145 mmol/L 136   Potassium 3.5 - 5.1 mmol/L 3.4   Chloride 98 - 111 mmol/L 105   CO2 22 - 32 mmol/L 20   Calcium 8.9 - 10.3 mg/dL 8.9   Total Protein 6.5 - 8.1 g/dL 6.1   Total Bilirubin 0.3 - 1.2 mg/dL 0.3   Alkaline Phos 38 - 126 U/L 225   AST 15 - 41 U/L 19   ALT 0 - 44 U/L 9    Edinburgh Score:    04/19/2021    5:50 AM  Edinburgh Postnatal Depression Scale Screening Tool  I have been able to laugh and see the funny side of things. 0  I have looked forward with enjoyment to things. 0  I have blamed myself unnecessarily when things went wrong. 1  I have been anxious or worried for no good reason. 3  I have felt scared or panicky for no good reason. 1  Things have been getting on top of me. 1  I  have been so unhappy that I have had difficulty sleeping. 0  I have felt sad or miserable. 0  I have been so unhappy that I have been crying. 0  The thought of harming myself has occurred to me. 0  Edinburgh Postnatal Depression Scale Total 6     After visit meds:  Allergies as of 07/22/2022   No Known Allergies   Med Rec must be completed prior to using this [facility]***        Discharge home in stable condition Infant Feeding: {Baby feeding:23562} Infant Disposition:{CHL IP OB HOME WITH UXYBFX:83291} Discharge instruction: per After Visit Summary and Postpartum booklet. Activity: Advance as tolerated. Pelvic rest for 6 weeks.  Diet: {OB BTYO:06004599} Future Appointments: Future Appointments  Date Time Provider [place]  07/23/2022   4:15 PM Helaine Chess [facility]  07/30/2022  3:15 PM Helaine Chess [facility]  08/06/2022  3:35 PM Helaine Chess [facility]  08/13/2022  2:15 PM [facility]  08/13/2022  3:15 PM Gabriel Carina, CNM [facility]   Follow up Visit:   Please schedule this patient for a Virtual postpartum visit in 4 weeks with the following provider: Any provider. Additional Postpartum F/U:BP check 1 week  Low risk pregnancy complicated by: HTN Delivery mode:  Vaginal, Spontaneous  Anticipated Birth Control:  Unsure   07/22/2022 Christin Fudge, CNM

## 2022-07-22 NOTE — Lactation Note (Addendum)
This note was copied from a baby's chart. Lactation Consultation Note  Patient Name: Boy Jaqulyn Chancellor ZOXWR'U Date: 07/22/2022 Reason for consult: Initial assessment;Early term 37-38.6wks Age:19 hours  P2, [redacted]w[redacted]d.  Unwrapped baby for feeding.  With first attempt in football position baby latched with ease but only sustained for a few minutes and fell asleep.  Second attempt baby had more difficulty latching with depth and fed for a few more minutes. Set up DEBP with 24 flange on R and 21 flange on L.   Encouraged her to pump q 3 hours. During latching mother started wincing in pain and took baby off breast and stated her cramping had become worse. Informed Deven RN and mother decided at this time she wanted to supplement with formula.  Reviewed volume guidelines.   Lactation information sheet given.  Stork pump paperwork emailed and pump given. Feeding Mother's Current Feeding Choice: Breast Milk and Formula  LATCH Score Latch: Repeated attempts needed to sustain latch, nipple held in mouth throughout feeding, stimulation needed to elicit sucking reflex.  Audible Swallowing: A few with stimulation  Type of Nipple: Everted at rest and after stimulation  Comfort (Breast/Nipple): Soft / non-tender  Hold (Positioning): Assistance needed to correctly position infant at breast and maintain latch.  LATCH Score: 7   Lactation Tools Discussed/Used Tools: Pump;Flanges Flange Size: 24;27 (27 left, 24 right) Breast pump type: Double-Electric Breast Pump;Manual Pump Education: Setup, frequency, and cleaning;Milk Storage Reason for Pumping: stimulation and supplementation Pumping frequency:  (q 3 hours)  Interventions Interventions: Breast feeding basics reviewed;Assisted with latch;Skin to skin;Hand express;Adjust position;Support pillows;DEBP;Education;LC Services brochure  Discharge Pump: Stork Pump (papwerwork started)  Consult Status Consult Status: Follow-up Date:  07/23/22 Follow-up type: In-patient    Dahlia Byes [facility] 07/22/2022, 9:39 AM

## 2022-07-22 NOTE — Anesthesia Preprocedure Evaluation (Signed)
Anesthesia Evaluation  Patient identified by MRN, date of birth, ID band Patient awake    Reviewed: Allergy & Precautions, H&P , NPO status , Patient's Chart, lab work & pertinent test results  History of Anesthesia Complications Negative for: history of anesthetic complications  Airway Mallampati: II  TM Distance: >3 FB Neck ROM: full    Dental no notable dental hx. (+) Teeth Intact   Pulmonary neg pulmonary ROS, former smoker   Pulmonary exam normal breath sounds clear to auscultation       Cardiovascular hypertension (Gestational), Normal cardiovascular exam Rhythm:regular Rate:Normal     Neuro/Psych negative neurological ROS  negative psych ROS   GI/Hepatic negative GI ROS, Neg liver ROS,,,  Endo/Other  negative endocrine ROS    Renal/GU negative Renal ROS  negative genitourinary   Musculoskeletal   Abdominal   Peds  Hematology  (+) Blood dyscrasia, anemia   Anesthesia Other Findings   Reproductive/Obstetrics (+) Pregnancy                             Anesthesia Physical Anesthesia Plan  ASA: 2  Anesthesia Plan: Epidural   Post-op Pain Management:    Induction:   PONV Risk Score and Plan:   Airway Management Planned:   Additional Equipment:   Intra-op Plan:   Post-operative Plan:   Informed Consent: I have reviewed the patients History and Physical, chart, labs and discussed the procedure including the risks, benefits and alternatives for the proposed anesthesia with the patient or authorized representative who has indicated his/her understanding and acceptance.       Plan Discussed with:   Anesthesia Plan Comments:         Anesthesia Quick Evaluation

## 2022-07-22 NOTE — Progress Notes (Signed)
Labor Progress Note Jean Roberts is a 21 y.o. G2P1001 at [redacted]w[redacted]d presented for IOL for gHTN S: doing well,  has had a few doses of fentanyl with relief  O:  BP (!) 140/82   Pulse 83   Temp 97.8 F (36.6 C) (Oral)   Resp 16   Ht 5\' 4"  (1.626 m)   Wt 72.6 kg   LMP  (LMP Unknown)   BMI 27.46 kg/m  EFM: 130/modeate/+accels, no decels  Contractions: on toco, q 2-4 mins, with some coupling. Contractions pattern noted to improve after AROM  CVE: Dilation: 4.5 Effacement (%): 60 Cervical Position: Posterior Station: -2 Presentation: Vertex Exam by:: Dr. [email or web address]   A&P: 21 y.o. G2P1001 [redacted]w[redacted]d on IOL for gHTN #Labor: Progressing well. S/p cytotec and FB. Now on pitocin at 10 units. AROM performed with clear fluid #Pain: IV fentanyl prn, epidural when ready #FWB: Cat 1 #GBS positive - PCN - adequate  gHTN: blood pressures elevated, but not in severe range. Asymptomatic. -continue to monitor  [redacted]w[redacted]d MD MPH OB Fellow, Faculty Practice [facility], Center for [facility] 07/22/2022

## 2022-07-22 NOTE — Anesthesia Postprocedure Evaluation (Signed)
Anesthesia Post Note  Patient: Jean Roberts  Procedure(s) Performed: AN AD HOC LABOR EPIDURAL     Patient location during evaluation: Mother Baby Anesthesia Type: Epidural Level of consciousness: awake and alert Pain management: pain level controlled Vital Signs Assessment: post-procedure vital signs reviewed and stable Respiratory status: spontaneous breathing, nonlabored ventilation and respiratory function stable Cardiovascular status: stable Postop Assessment: no headache, no backache and epidural receding Anesthetic complications: no   No notable events documented.  Last Vitals:  Vitals:   07/22/22 0501 07/22/22 0719  BP: 139/84 137/83  Pulse: 91 92  Resp: 18   Temp: 37.2 C   SpO2: 99%     Last Pain:  Vitals:   07/22/22 0501  TempSrc: Oral  PainSc:    Pain Goal:                   Alyda Megna

## 2022-07-22 NOTE — Anesthesia Procedure Notes (Signed)
Epidural Patient location during procedure: OB Start time: 07/22/2022 12:56 AM End time: 07/22/2022 1:06 AM  Staffing Anesthesiologist: Leonides Grills, MD Performed: anesthesiologist   Preanesthetic Checklist Completed: patient identified, IV checked, site marked, risks and benefits discussed, monitors and equipment checked, pre-op evaluation and timeout performed  Epidural Patient position: sitting Prep: DuraPrep Patient monitoring: heart rate, cardiac monitor, continuous pulse ox and blood pressure Approach: midline Location: L3-L4 Injection technique: LOR air  Needle:  Needle type: Tuohy  Needle gauge: 17 G Needle length: 9 cm Needle insertion depth: 6 cm Catheter type: closed end flexible Catheter size: 19 Gauge Catheter at skin depth: 11 cm Test dose: negative and 1.5% lidocaine with Epi 1:200 K  Assessment Events: blood not aspirated, injection not painful, no injection resistance and negative IV test  Additional Notes Informed consent obtained prior to proceeding including risk of failure, 1% risk of PDPH, risk of minor discomfort and bruising. Discussed alternatives to epidural analgesia and patient desires to proceed.  Timeout performed pre-procedure verifying patient name, procedure, and platelet count.  Patient tolerated procedure well. Reason for block:procedure for pain

## 2022-07-22 NOTE — Lactation Note (Signed)
This note was copied from a baby's chart. Lactation Consultation Note  Patient Name: Jean Roberts [id number] Date: 07/22/2022   Age:19 hours Attempted to see mom but everyone in room sleeping.  Maternal Data    Feeding    LATCH Score                    Lactation Tools Discussed/Used    Interventions    Discharge    Consult Status      Charyl Dancer 07/22/2022, 6:33 AM

## 2022-07-23 ENCOUNTER — Other Ambulatory Visit (HOSPITAL_COMMUNITY): Payer: Self-pay

## 2022-07-23 ENCOUNTER — Encounter: Payer: Self-pay | Admitting: Certified Nurse Midwife

## 2022-07-23 MED ORDER — FUROSEMIDE 20 MG PO TABS
20.0000 mg | ORAL_TABLET | Freq: Every day | ORAL | 0 refills | Status: DC
  Filled 2022-07-23: qty 4, 4d supply, fill #0

## 2022-07-23 MED ORDER — BENZOCAINE-MENTHOL 20-0.5 % EX AERO
1.0000 | INHALATION_SPRAY | CUTANEOUS | 0 refills | Status: DC | PRN
  Filled 2022-07-23: qty 78, fill #0

## 2022-07-23 MED ORDER — ACETAMINOPHEN 325 MG PO TABS
650.0000 mg | ORAL_TABLET | ORAL | 0 refills | Status: AC | PRN
  Filled 2022-07-23: qty 30, 3d supply, fill #0

## 2022-07-23 MED ORDER — SENNOSIDES-DOCUSATE SODIUM 8.6-50 MG PO TABS
2.0000 | ORAL_TABLET | Freq: Every day | ORAL | 0 refills | Status: DC
  Filled 2022-07-23: qty 30, 15d supply, fill #0

## 2022-07-23 MED ORDER — NIFEDIPINE ER 30 MG PO TB24
30.0000 mg | ORAL_TABLET | Freq: Every day | ORAL | 0 refills | Status: DC
  Filled 2022-07-23: qty 30, 30d supply, fill #0

## 2022-07-23 MED ORDER — IBUPROFEN 600 MG PO TABS
600.0000 mg | ORAL_TABLET | Freq: Four times a day (QID) | ORAL | 0 refills | Status: DC
  Filled 2022-07-23: qty 30, 8d supply, fill #0

## 2022-07-23 NOTE — Progress Notes (Signed)
Circumcision Consent   -Circumcision procedure details discussed including usage of local anesthesia, infant soother, and removal and disposal of foreskin. -Risks and benefits of procedure were reviewed including, but not limited to:  *Benefits include reduction in the rates of urinary tract infection (UTI), penile cancer, some sexually transmitted infections, penile inflammatory, and retractile disorders, as well as easier hygiene.   *Risks include bleeding, infection, injury of glans which may lead to need for additional surgery, penile deformity, or urinary tract issues, unsatisfactory cosmetic appearance and other potential complications related to the procedure.   -Informed that procedure will not be performed if provider deems inappropriate d/t penile size, noted deformity, or unsatisfactory pediatric evaluation. -It was emphasized that this is an elective procedure.   -Post circumcision care discussed.   Patient wants to proceed with circumcision. Informed that team would be notified and complete prior to discharge and upon satisfactory pediatric evaluation of infant.    Myrtie Hawk, DO FMOB Fellow, Faculty practice [facility], Center for [facility] 07/23/22  11:24 AM

## 2022-07-23 NOTE — Progress Notes (Signed)
Patient ID: Jean Roberts, female   DOB: 11-11-2000, 21 y.o.   MRN: [id number] POSTPARTUM PROGRESS NOTE  Post Partum Day 1  Subjective:  Jean Roberts is a 21 y.o. G2P2002 s/p spontaneous vaginal delivery at [redacted]w[redacted]d.  IOL due to gestational HTN. No acute events overnight.  Pt denies problems with ambulating, voiding or po intake.  She denies nausea or vomiting.  Pain is well controlled.  She has had flatus. She has not had bowel movement.  Lochia Minimal. Denies clots. Denies shortness of breath and dizziness.   Objective: Blood pressure 130/82, pulse 70, temperature 98.6 F (37 C), temperature source Oral, resp. rate 18, height 5\' 4"  (1.626 m), weight 72.6 kg, SpO2 100 %, unknown if currently breastfeeding.  Physical Exam:  General: alert, cooperative and no distress Chest: no respiratory distress Heart:regular rate, distal pulses intact Abdomen: soft, nontender,  Uterine Fundus: firm, appropriately tender DVT Evaluation: No calf swelling or tenderness Extremities: No peripheral edema Skin: warm, dry  Recent Labs    07/21/22 1955 07/22/22 0611  HGB 8.3* 8.0*  HCT 26.8* 25.4*    Assessment/Plan: Jean Roberts is a 21 y.o. 12 s/p spontaneous vaginal delivery at [redacted]w[redacted]d. IOL due to gestational HTN.   PPD#1 - Doing well, Gestational HTN - BP trending 120-140s/80s. Continues on procardia and lasix. Pt tested positive for chlamydia 07/17/22, treated with azithromycin PO. Will set up TOC in 4 weeks. Asymptomatic Anemia: most recent Hgb 8.0 on 07/20/22. Will start oral iron.  Contraception: Depo. Feeding: Both bottle and breast feeding. Dispo: Plan for discharge - patient would like to go home today.   LOS: 2 days   13/12/23, Medical Student, CNM 07/23/2022, 7:12 AM

## 2022-07-23 NOTE — Progress Notes (Addendum)
CSW received consult for hx of anxiety/depression and suicide attempt 04/23.  CSW met with MOB to offer support and complete assessment.    When CSW arrived, MOB was resting in bed and per MOB infant was in the nursery for his circumcision.  MOB's mother was also present and with MOB's permission, CSW asked MGM to leave in order to assess MOB in private. MOB was polite, easy to engage, forthcoming, and receptive to meeting with CSW.    CSW asked about MOB's MH hx.  MOB acknowledged a hx of anx/dep and report she was dx in April 2023 after her suicide attempt.  Per MOB she felt overwhelmed about being a mom of 8 month old, recent finding out she was pregnant again, and relationship ending with FOB. Per MOB, she feels much better and her symptoms have subsided.  MOB shared that she is not currently taking any medication and she stopped going to therapy due to scheduling conflict.  CSW offered additional outpatient therapy resources and MOB declined.   CSW provided education regarding the baby blues period vs. perinatal mood disorders, discussed treatment and gave resources for mental health follow up if concerns arise.  CSW recommends self-evaluation during the postpartum time period using the New Mom Checklist from Postpartum Progress and encouraged MOB to contact a medical professional if symptoms are noted at any time.  MOB presented with insight and awareness and did not display any acute MH symptoms.  MOB shared feeling comfortable seeking help if need and identified her mother, sisters, and FOB as her support team.   MOB's Edinburgh Score is 4.    CSW identifies no further need for intervention and no barriers to discharge at this time.   Souleymane Saiki Boyd-Gilyard, MSW, LCSW Clinical Social Work [phone]  

## 2022-07-24 ENCOUNTER — Encounter: Payer: Self-pay | Admitting: Obstetrics and Gynecology

## 2022-07-24 NOTE — Discharge Summary (Signed)
Postpartum Discharge Summary     Patient Name: Jean Roberts DOB: 14-Jul-2001 MRN: [id number]  Date of admission: 07/21/2022 Delivery date:07/22/2022  Delivering provider: Lowry Ram  Date of discharge: 07/24/2022  Admitting diagnosis: Gestational hypertension [O13.9] Intrauterine pregnancy: [redacted]w[redacted]d    Secondary diagnosis:  Active Problems:   Adjustment disorder with mixed anxiety and depressed mood   Unsuccessful suicide attempt ([facility]   Gestational hypertension   Spontaneous vaginal delivery Anemia of pregnancy Additional problems: chlamydia, treated inpt    Discharge diagnosis: Term Pregnancy Delivered and Gestational Hypertension                                              Post partum procedures: n/a Augmentation: AROM, Pitocin, Cytotec, and IP Foley Complications: None  Hospital course: Induction of Labor With Vaginal Delivery   21y.o. yo G2P1001 at 389w1das admitted to the hospital 07/21/2022 for induction of labor.  Indication for induction: Gestational hypertension.  Patient had an labor course complicated bynothing Membrane Rupture Time/Date: 12:21 AM ,07/22/2022   Delivery Method:Vaginal, Spontaneous  Episiotomy: None  Lacerations:  2nd degree  Details of delivery can be found in separate delivery note.  Patient had a postpartum course complicated by none. Patient is discharged home 07/24/22. We discussed need for close follow up to check on mood and continued behavioral health appointments post partum,  Newborn Data: Birth date:07/22/2022  Birth time:1:47 AM  Gender:Female  Living status:Living  Apgars:8 ,9  Weight:2770 g   Magnesium Sulfate received: No BMZ received: No Rhophylac:N/A MMR:N/A T-DaP:Given prenatally Flu: No Transfusion:No  Physical exam  Vitals:   07/23/22 1259 07/23/22 1545 07/23/22 2222 07/24/22 0558  BP: 129/77 129/67 (!) 140/69 114/63  Pulse: 96 79 72 77  Resp: _0 Temp: 98.4 F (36.9 C) 99 F (37.2 C) 97.8 F (36.6  C) 98.2 F (36.8 C)  TempSrc: Oral Oral Oral Oral  SpO2: 98% 100% 100% 100%  Weight:      Height:       General: alert, cooperative, and no distress Lochia: appropriate Uterine Fundus: firm Incision: N/A DVT Evaluation: No evidence of DVT seen on physical exam. Labs: Lab Results  Component Value Date   WBC 13.1 (H) 07/22/2022   HGB 8.0 (L) 07/22/2022   HCT 25.4 (L) 07/22/2022   MCV 63.5 (L) 07/22/2022   PLT 195 07/22/2022      Latest Ref Rng & Units 07/21/2022    9:57 AM  CMP  Glucose 70 - 99 mg/dL 98   BUN 6 - 20 mg/dL 6   Creatinine 0.44 - 1.00 mg/dL 0.78   Sodium 135 - 145 mmol/L 136   Potassium 3.5 - 5.1 mmol/L 3.4   Chloride 98 - 111 mmol/L 105   CO2 22 - 32 mmol/L 20   Calcium 8.9 - 10.3 mg/dL 8.9   Total Protein 6.5 - 8.1 g/dL 6.1   Total Bilirubin 0.3 - 1.2 mg/dL 0.3   Alkaline Phos 38 - 126 U/L 225   AST 15 - 41 U/L 19   ALT 0 - 44 U/L 9    Edinburgh Score:    07/22/2022   11:22 AM  Edinburgh Postnatal Depression Scale Screening Tool  I have been able to laugh and see the funny side of things. 0  I have looked forward with enjoyment to things. 0  I  have blamed myself unnecessarily when things went wrong. 1  I have been anxious or worried for no good reason. 1  I have felt scared or panicky for no good reason. 1  Things have been getting on top of me. 1  I have been so unhappy that I have had difficulty sleeping. 0  I have felt sad or miserable. 0  I have been so unhappy that I have been crying. 0  The thought of harming myself has occurred to me. 0  Edinburgh Postnatal Depression Scale Total 4     After visit meds:  Allergies as of 07/24/2022   No Known Allergies      Medication List     TAKE these medications    acetaminophen 325 MG tablet Commonly known as: Tylenol Take 2 tablets (650 mg total) by mouth every 4 (four) hours as needed (for pain scale < 4). What changed:  medication strength how much to take when to take  this reasons to take this   benzocaine-Menthol 20-0.5 % Aero Commonly known as: DERMOPLAST Apply 1 Application topically as needed for irritation (perineal discomfort).   butalbital-acetaminophen-caffeine 50-325-40 MG tablet Commonly known as: FIORICET Take 1-2 tablets by mouth every 6 (six) hours as needed for headache.   furosemide 20 MG tablet Commonly known as: LASIX Take 1 tablet (20 mg total) by mouth daily for 4 days.   ibuprofen 600 MG tablet Commonly known as: ADVIL Take 1 tablet (600 mg total) by mouth every 6 (six) hours.   NIFEdipine 30 MG 24 hr tablet Commonly known as: ADALAT CC Take 1 tablet (30 mg total) by mouth daily.   PrePLUS 27-1 MG Tabs Take 1 tablet by mouth daily. What changed: when to take this   Senexon-S 8.6-50 MG tablet Generic drug: senna-docusate Take 2 tablets by mouth daily.         Discharge home in stable condition Infant Feeding: Bottle and Breast Infant Disposition:home with mother Discharge instruction: per After Visit Summary and Postpartum booklet. Activity: Advance as tolerated. Pelvic rest for 6 weeks.  Diet: routine diet Future Appointments: Future Appointments  Date Time Provider [place]  07/29/2022  8:15 AM [facility]  07/29/2022 10:20 AM [facility] NURSE [facility]  08/20/2022  1:15 PM Gabriel Carina, CNM [facility]    Liliane Channel MD MPH OB Fellow, [place] for [place] 07/24/2022

## 2022-07-24 NOTE — BH Specialist Note (Unsigned)
Pt did not arrive to video visit and did not answer the phone; Left HIPPA-compliant message to call back Javeon Macmurray from [facility] at  [phone] (Rosco Harriott's office).  ?; left MyChart message for patient.  ? ?

## 2022-07-24 NOTE — Progress Notes (Signed)
Patient ID: Jean Roberts, female   DOB: 2001/08/25, 21 y.o.   MRN: [id number] POSTPARTUM PROGRESS NOTE  Post Partum Day 2  Subjective:  Jean Roberts is a 21 y.o. G2P2002 s/p spontaneous vaginal delivery at [redacted]w[redacted]d.  IOL for gestational hypertension. No acute events overnight.  Pt denies problems with ambulating, voiding or po intake.  She denies nausea or vomiting.  Pain is well controlled.  She has had flatus. She has not had bowel movement.  Lochia Minimal. Patient reports that she has passed some clots, describes them as "smaller than a quarter." Denies dizziness or shortness of breath.   Objective: Blood pressure 114/63, pulse 77, temperature 98.2 F (36.8 C), temperature source Oral, resp. rate 17, height 5\' 4"  (1.626 m), weight 72.6 kg, SpO2 100 %, unknown if currently breastfeeding.  Physical Exam:  General: alert, cooperative and no distress Chest: no respiratory distress Heart:regular rate, distal pulses intact Abdomen: soft, nontender,  Uterine Fundus: firm, appropriately tender DVT Evaluation: No calf swelling or tenderness Extremities: No peripheral edema Skin: warm, dry  Recent Labs    07/21/22 1955 07/22/22 0611  HGB 8.3* 8.0*  HCT 26.8* 25.4*    Assessment/Plan: Jean Roberts is a 21 y.o. 12 s/p spontaneous vaginal delivery at [redacted]w[redacted]d induced for gestational HTN.  PPD#2 - Doing well, Patient tested positive for chlamydia on 07/17/22 and treated with azithromycin, will set up 4 week TOC. History of suicide attempt in April 2023, seen by social work who found no barriers for discharge.  Gestational HTN: BP trending 120-130s/80s. Continues on procardia and lasix.  Asymptomatic Anemia: Most recent Hgb 8.0 on 07/22/22. Will start oral iron. Contraception: Depo Feeding: Breast and bottle feeding.  Dispo: Plan for discharge - patient would like to go home today.   LOS: 3 days   07/24/22, Medical Student, CNM 07/24/2022, 6:46 AM

## 2022-07-29 ENCOUNTER — Ambulatory Visit (INDEPENDENT_AMBULATORY_CARE_PROVIDER_SITE_OTHER): Payer: Medicaid Other | Admitting: General Practice

## 2022-07-29 ENCOUNTER — Ambulatory Visit: Payer: Medicaid Other | Admitting: Clinical

## 2022-07-29 VITALS — BP 134/88 | HR 82 | Ht 64.0 in | Wt 145.0 lb

## 2022-07-29 DIAGNOSIS — Z91199 Patient's noncompliance with other medical treatment and regimen due to unspecified reason: Secondary | ICD-10-CM

## 2022-07-29 DIAGNOSIS — Z30013 Encounter for initial prescription of injectable contraceptive: Secondary | ICD-10-CM

## 2022-07-29 DIAGNOSIS — Z0130 Encounter for examination of blood pressure without abnormal findings: Secondary | ICD-10-CM

## 2022-07-29 MED ORDER — MEDROXYPROGESTERONE ACETATE 150 MG/ML IM SUSP
150.0000 mg | Freq: Once | INTRAMUSCULAR | Status: AC
  Administered 2022-07-29: 150 mg via INTRAMUSCULAR

## 2022-07-29 NOTE — Progress Notes (Signed)
Patient presents to office today for BP check following up from vaginal delivery on 11/14. She reports one headache with dizziness that improved with tylenol & rest. She did notice seeing some spots in her vision yesterday. Patient reports taking Nifedipine daily but has not taken Lasix Rx. She asked about receiving Depo injection as she didn't receive it in the hospital. Depo injection given and discussed timing of injections and common side effects. Patient will continue Nifedipine until pp visit and will notify us of worsening symptoms.  Chase Caller RN BSN 07/29/22

## 2022-07-30 ENCOUNTER — Encounter: Payer: Self-pay | Admitting: Certified Nurse Midwife

## 2022-08-06 ENCOUNTER — Encounter: Payer: Self-pay | Admitting: Certified Nurse Midwife

## 2022-08-13 ENCOUNTER — Encounter: Payer: Self-pay | Admitting: Certified Nurse Midwife

## 2022-08-13 ENCOUNTER — Other Ambulatory Visit: Payer: Self-pay

## 2022-08-19 ENCOUNTER — Encounter: Payer: Self-pay | Admitting: Certified Nurse Midwife

## 2022-08-19 NOTE — Progress Notes (Unsigned)
Postpartum Visit Note  Jean Roberts is a 21 y.o. G42P2002 female who presents for a postpartum visit. She is 4 weeks postpartum following a normal spontaneous vaginal delivery.  I have fully reviewed the prenatal and intrapartum course. The delivery was at [redacted]w[redacted]d.  Anesthesia: epidural. Postpartum course has been uncomplicated. Baby is doing well. Baby is feeding by bottle - Similac 360 . Bleeding staining only. Bowel function is normal. Bladder function is normal. Patient is sexually active. Contraception method is Depo-Provera injections; next injection 10/14/22-10/28/22. Postpartum depression screening: negative.   Upstream - 08/20/22 1349       Pregnancy Intention Screening   Does the patient want to become pregnant in the next year? No    Does the patient's partner want to become pregnant in the next year? No    Would the patient like to discuss contraceptive options today? N/A   on Depo     Contraception Wrap Up   Current Method Hormonal Injection    End Method Hormonal Injection    Contraception Counseling Provided No    How was the end contraceptive method provided? N/A            The pregnancy intention screening data noted above was reviewed. Potential methods of contraception were discussed. The patient elected to proceed with Hormonal Injection.   Edinburgh Postnatal Depression Scale - 08/20/22 1335       Edinburgh Postnatal Depression Scale:  In the Past 7 Days   I have been able to laugh and see the funny side of things. 0    I have looked forward with enjoyment to things. 0    I have blamed myself unnecessarily when things went wrong. 0    I have been anxious or worried for no good reason. 0    I have felt scared or panicky for no good reason. 0    Things have been getting on top of me. 0    I have been so unhappy that I have had difficulty sleeping. 0    I have felt sad or miserable. 0    I have been so unhappy that I have been crying. 0    The thought of  harming myself has occurred to me. 0    Edinburgh Postnatal Depression Scale Total 0            Health Maintenance Due  Topic Date Due   COVID-19 Vaccine (1) Never done   HPV VACCINES (1 - 2-dose series) Never done   The following portions of the patient's history were reviewed and updated as appropriate: allergies, current medications, past family history, past medical history, past social history, past surgical history, and problem list.  Review of Systems Pertinent items noted in HPI and remainder of comprehensive ROS otherwise negative.  Objective:  BP 107/66   Pulse (!) 110   Wt 150 lb (68 kg)   Breastfeeding No   BMI 25.75 kg/m    General:  alert, cooperative, appears stated age, and no distress   Breasts:  normal  Lungs: Normal effort  Heart:  regular rate and rhythm  Abdomen: Soft, non-tender    Wound N/A  GU exam:  not indicated, will self-swab       Assessment:   Postpartum care and examination  Chlamydia - Plan: Cervicovaginal ancillary only( [place])  History of gestational hypertension  - BP normal today, has not been elevated since discharge  Plan:   Essential components of care per ACOG  recommendations:  1.  Mood and well being: Patient with negative depression screening today. Reviewed local resources for support.  - Patient tobacco use? No.   - hx of drug use? No.    2. Infant care and feeding:  -Patient currently breastmilk feeding? No.  -Social determinants of health (SDOH) reviewed in EPIC. No concerns  3. Sexuality, contraception and birth spacing - Patient does not want a pregnancy in the next year.  Desired family size is 2 children.  - Reviewed reproductive life planning. Reviewed contraceptive methods based on pt preferences and effectiveness.  Patient desired Hormonal Injection today.   - Discussed birth spacing of 18 months  4. Sleep and fatigue -Encouraged family/partner/community support of 4 hrs of uninterrupted sleep to  help with mood and fatigue  5. Physical Recovery  - Discussed patients delivery and complications. She describes her labor as good. - Patient had a Vaginal, no problems at delivery. Patient had a 2nd degree laceration. Perineal healing reviewed. Patient expressed understanding - Patient has urinary incontinence? No. - Patient is safe to resume physical and sexual activity  6.  Health Maintenance - HM due items addressed Yes - Pap smear not done at today's visit, not indicated due to age -Breast Cancer screening indicated? No.   7. Chronic Disease/Pregnancy Condition follow up: None - PCP follow up as needed  Follow up in two months for next depo shot.  Bernerd Limbo, CNM [facility] [employer], [facility]

## 2022-08-20 ENCOUNTER — Encounter: Payer: Self-pay | Admitting: Certified Nurse Midwife

## 2022-08-20 ENCOUNTER — Other Ambulatory Visit: Payer: Self-pay

## 2022-08-20 ENCOUNTER — Ambulatory Visit (INDEPENDENT_AMBULATORY_CARE_PROVIDER_SITE_OTHER): Payer: Medicaid Other | Admitting: Certified Nurse Midwife

## 2022-08-20 ENCOUNTER — Other Ambulatory Visit (HOSPITAL_COMMUNITY)
Admission: RE | Admit: 2022-08-20 | Discharge: 2022-08-20 | Disposition: A | Discharge status: Home / Self Care | Payer: Medicaid Other | Source: Ambulatory Visit | Attending: Certified Nurse Midwife | Admitting: Certified Nurse Midwife

## 2022-08-20 DIAGNOSIS — Z8759 Personal history of other complications of pregnancy, childbirth and the puerperium: Secondary | ICD-10-CM | POA: Diagnosis not present

## 2022-08-20 DIAGNOSIS — A749 Chlamydial infection, unspecified: Secondary | ICD-10-CM

## 2022-08-21 LAB — CERVICOVAGINAL ANCILLARY ONLY
Chlamydia: NEGATIVE
Comment: NEGATIVE
Comment: NEGATIVE
Comment: NORMAL
Neisseria Gonorrhea: NEGATIVE
Trichomonas: NEGATIVE

## 2022-09-21 IMAGING — US US OB < 14 WEEKS - US OB TV
1 series · 15 of 28 positions shown · non-contrast
Comparison: None.

CLINICAL DATA: Uncertain dating, 12 weeks 2 days by LMP

EXAM:
OBSTETRIC <14 WK US AND TRANSVAGINAL OB US
TECHNIQUE: Both transabdominal and transvaginal ultrasound examinations were
performed for complete evaluation of the gestation as well as the
maternal uterus, adnexal regions, and pelvic cul-de-sac.
Transvaginal technique was performed to assess early pregnancy.

[Series 1: us ob < 14 weeks - us ob tv · 15 of 44 slices shown]
[im 1/44]
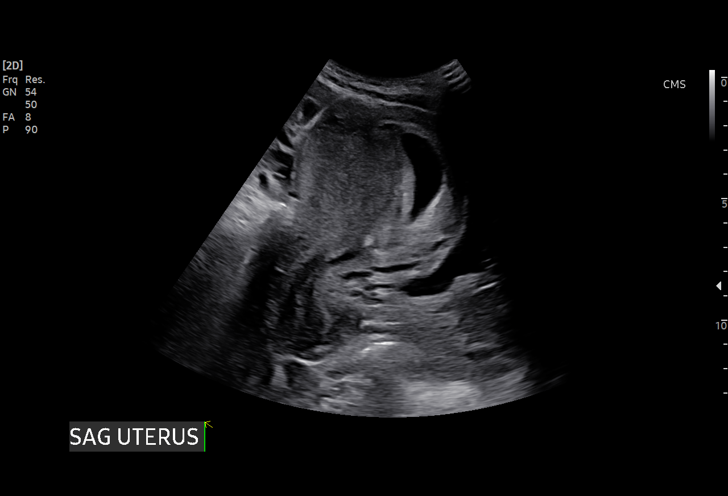
[im 4/44]
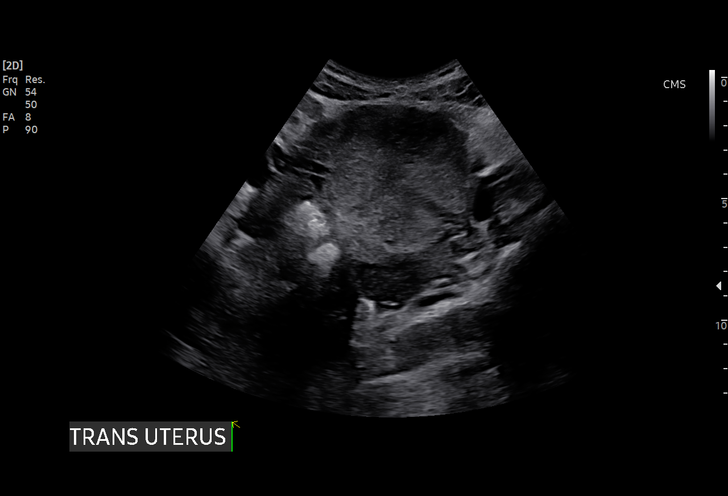
[im 7/44]
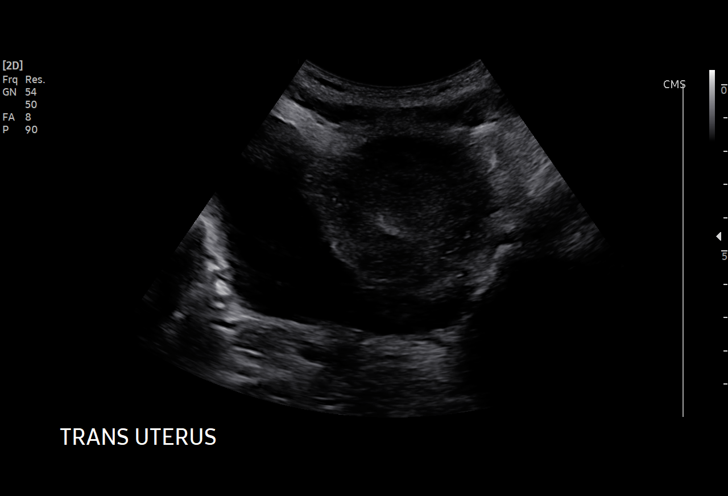
[im 10/44]
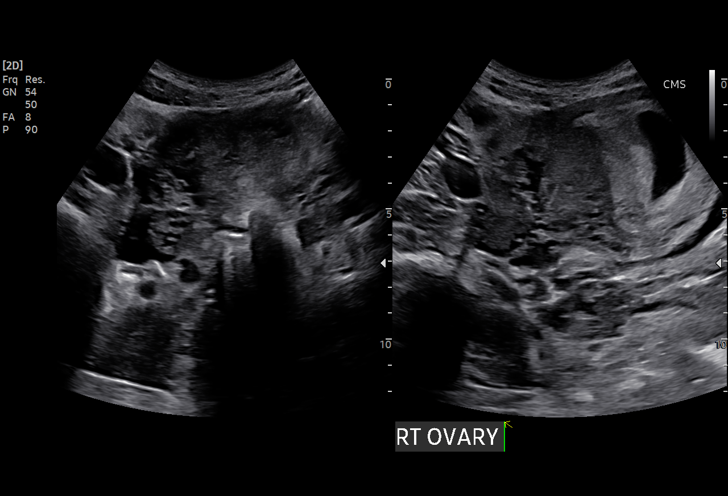
[im 13/44]
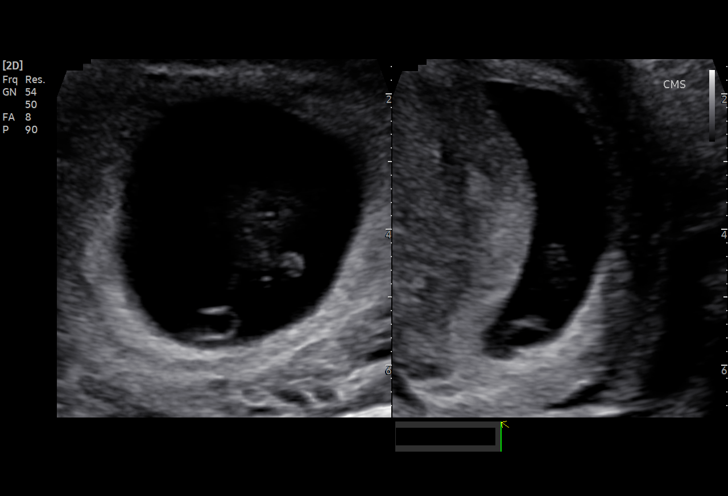
[im 16/44]
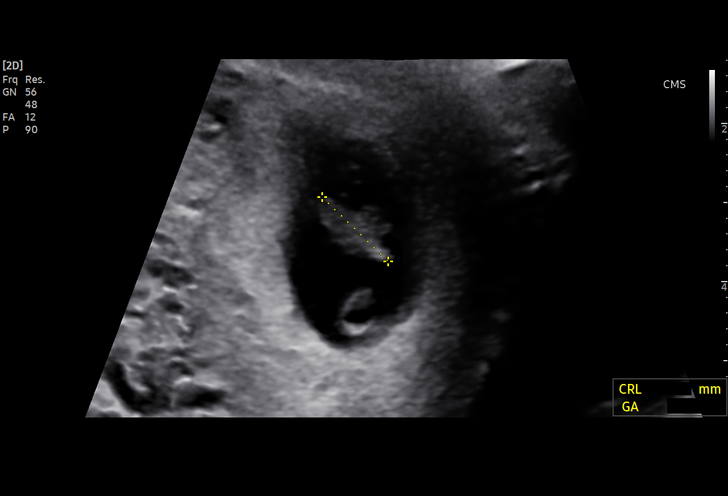
[im 20/44]
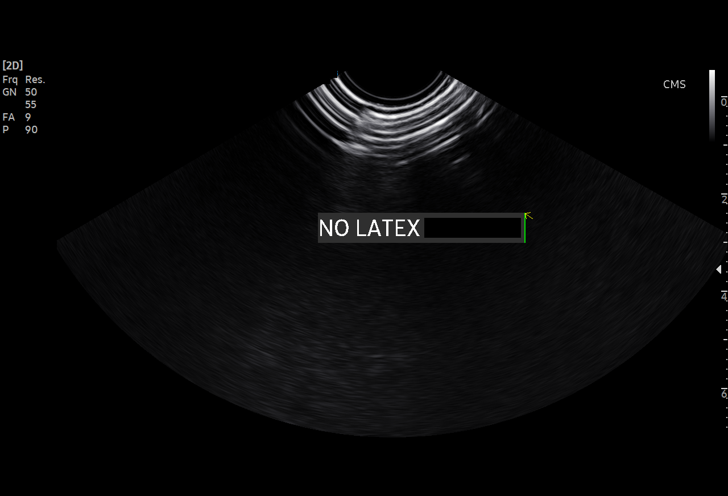
[im 23/44]
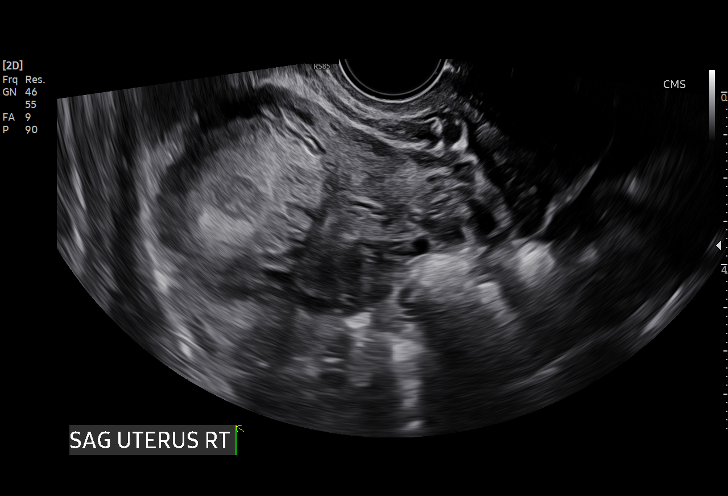
[im 24/44]
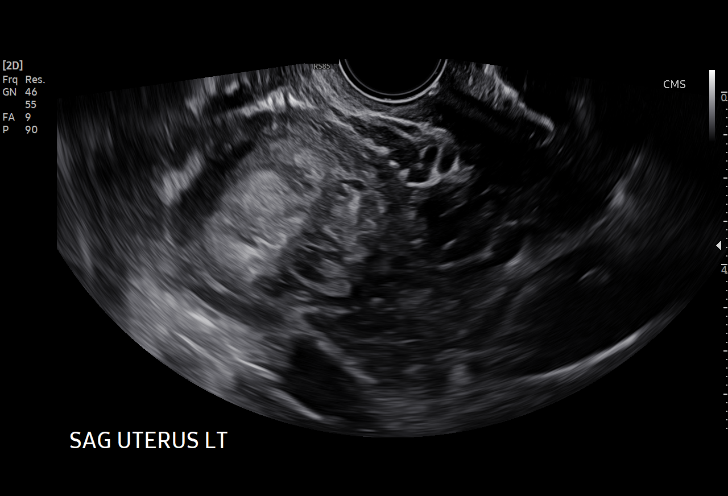
[im 28/44]
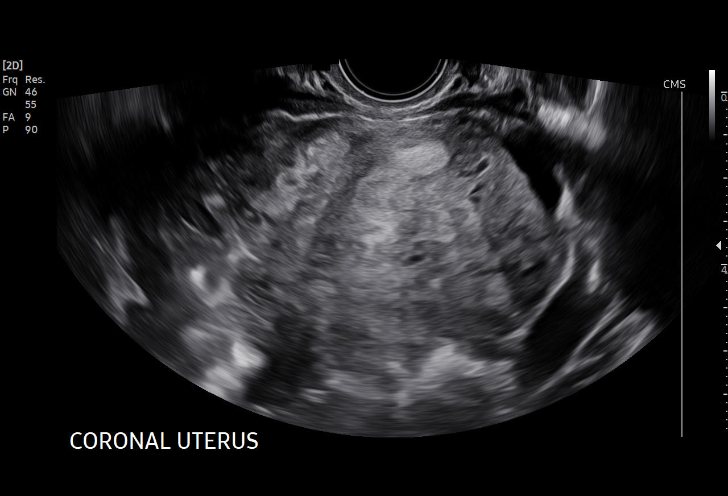
[im 31/44]
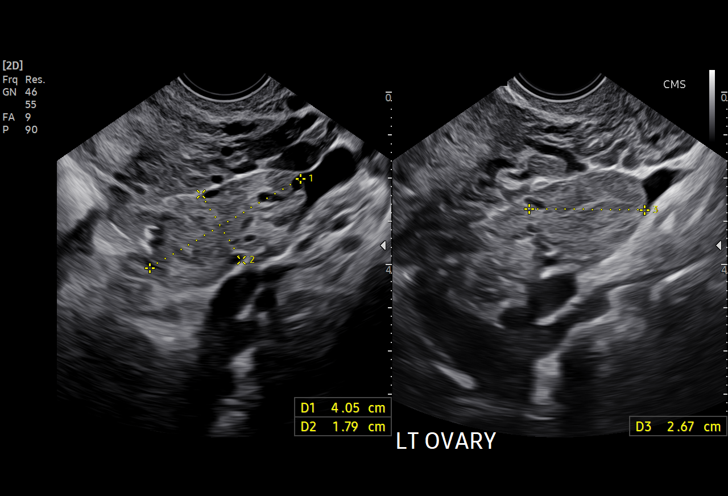
[im 34/44]
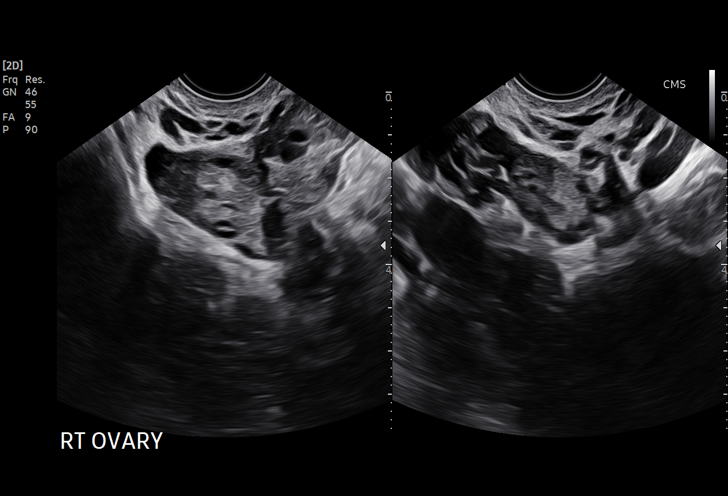
[im 37/44]
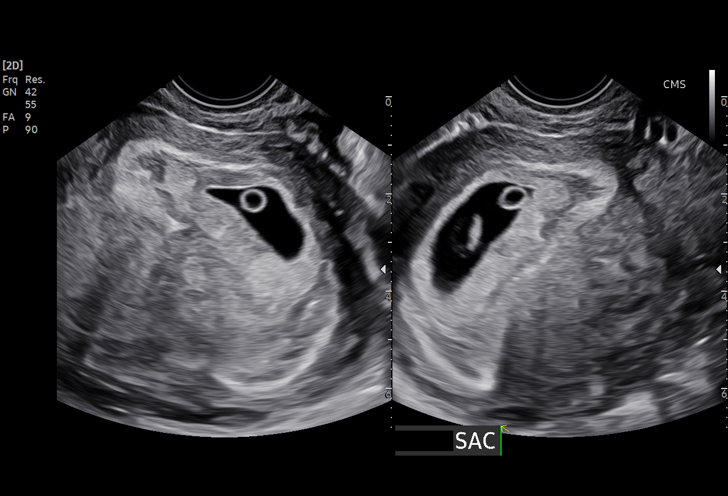
[im 40/44]
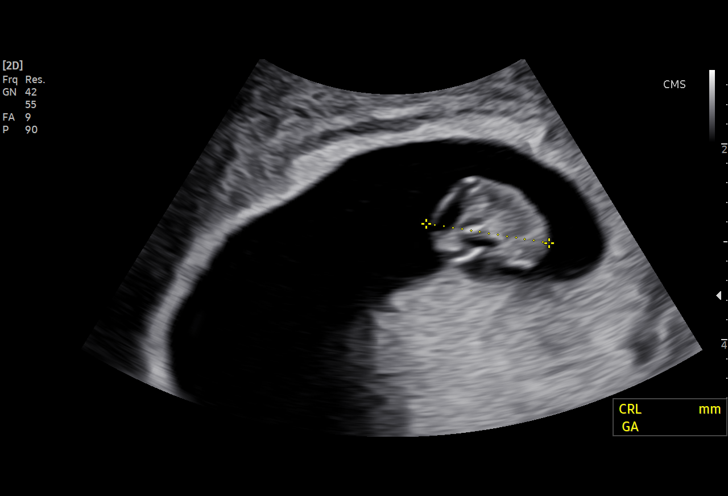
[im 44/44]
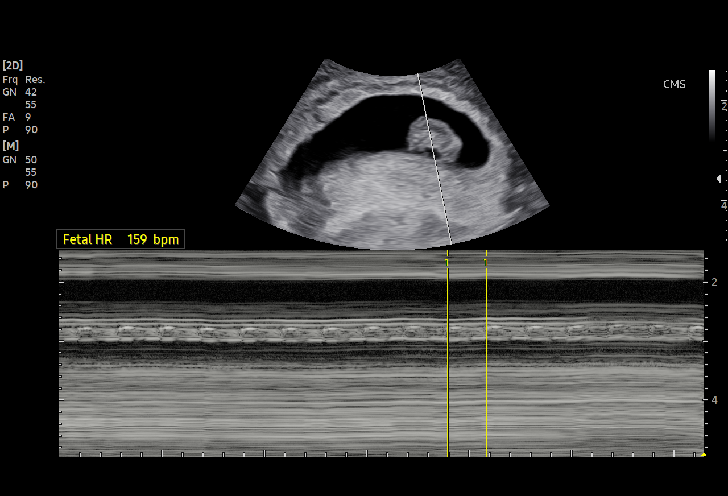

[15 of 28 positions shown; findings below may reference images not displayed]

FINDINGS: Intrauterine gestational sac: Present

Yolk sac:  Present

Embryo:  Present

Cardiac Activity: Present

Average heart Rate: 155 bpm

CRL:  12.1 mm   7 w   3 d                  US EDC: 08/11/2022

Subchorionic hemorrhage:  None visualized.

Maternal uterus/adnexae: Unremarkable
IMPRESSION: Single living intrauterine pregnancy measuring 7 weeks 3 days.

## 2022-10-14 ENCOUNTER — Ambulatory Visit (INDEPENDENT_AMBULATORY_CARE_PROVIDER_SITE_OTHER): Payer: Medicaid Other

## 2022-10-14 ENCOUNTER — Other Ambulatory Visit: Payer: Self-pay

## 2022-10-14 VITALS — BP 134/79 | HR 64 | Wt 139.7 lb

## 2022-10-14 DIAGNOSIS — Z3042 Encounter for surveillance of injectable contraceptive: Secondary | ICD-10-CM

## 2022-10-14 MED ORDER — MEDROXYPROGESTERONE ACETATE 150 MG/ML IM SUSP
150.0000 mg | Freq: Once | INTRAMUSCULAR | Status: AC
  Administered 2022-10-14: 150 mg via INTRAMUSCULAR

## 2022-10-14 NOTE — Progress Notes (Signed)
Jean Roberts here for Depo-Provera Injection. Injection administered without complication. Patient will return in 3 months for next injection between 12/31/22 and 01/14/23. Next annual visit due December 2024.   Annabell Howells, RN 10/14/2022  2:37 PM

## 2022-12-31 ENCOUNTER — Ambulatory Visit: Payer: Medicaid Other

## 2023-03-31 ENCOUNTER — Ambulatory Visit (INDEPENDENT_AMBULATORY_CARE_PROVIDER_SITE_OTHER): Payer: Medicaid Other

## 2023-03-31 VITALS — BP 120/76 | HR 54 | Ht 64.0 in | Wt 117.2 lb

## 2023-03-31 DIAGNOSIS — Z3202 Encounter for pregnancy test, result negative: Secondary | ICD-10-CM

## 2023-03-31 LAB — POCT PREGNANCY, URINE: Preg Test, Ur: NEGATIVE

## 2023-03-31 NOTE — Progress Notes (Signed)
Pt here today to restart Depo Provera.  Pt last dose in 10/2022 pt is 11 weeks late.  UPT negative.  Pt reports that she last had unprotected intercourse about a week ago.  Pt informs me that her LMP was 03/02/23.  Per standing protocol, pt would need to abstain from intercourse for two weeks, come back retest for pregnancy, and if negative will be able to receive Depo Provera.  Pt advised recommendation and agreed to coming in on 04/14/23.

## 2023-04-14 ENCOUNTER — Ambulatory Visit: Payer: Medicaid Other

## 2023-07-09 ENCOUNTER — Ambulatory Visit: Payer: Medicaid Other

## 2024-03-22 ENCOUNTER — Other Ambulatory Visit (HOSPITAL_COMMUNITY)
Admission: RE | Admit: 2024-03-22 | Discharge: 2024-03-22 | Disposition: A | Discharge status: Home / Self Care | Source: Ambulatory Visit | Attending: Obstetrics and Gynecology | Admitting: Obstetrics and Gynecology

## 2024-03-22 ENCOUNTER — Ambulatory Visit: Admitting: Obstetrics and Gynecology

## 2024-03-22 ENCOUNTER — Encounter: Payer: Self-pay | Admitting: Obstetrics and Gynecology

## 2024-03-22 VITALS — BP 125/77 | HR 67 | Wt 127.5 lb

## 2024-03-22 DIAGNOSIS — Z3009 Encounter for other general counseling and advice on contraception: Secondary | ICD-10-CM | POA: Diagnosis present

## 2024-03-22 DIAGNOSIS — Z3202 Encounter for pregnancy test, result negative: Secondary | ICD-10-CM

## 2024-03-22 DIAGNOSIS — Z3046 Encounter for surveillance of implantable subdermal contraceptive: Secondary | ICD-10-CM | POA: Diagnosis not present

## 2024-03-22 DIAGNOSIS — Z113 Encounter for screening for infections with a predominantly sexual mode of transmission: Secondary | ICD-10-CM | POA: Diagnosis not present

## 2024-03-22 DIAGNOSIS — Z01419 Encounter for gynecological examination (general) (routine) without abnormal findings: Secondary | ICD-10-CM

## 2024-03-22 LAB — POCT PREGNANCY, URINE: Preg Test, Ur: NEGATIVE

## 2024-03-22 MED ORDER — MEDROXYPROGESTERONE ACETATE 150 MG/ML IM SUSY
150.0000 mg | PREFILLED_SYRINGE | Freq: Once | INTRAMUSCULAR | Status: AC
  Administered 2024-03-22: 150 mg via INTRAMUSCULAR

## 2024-03-22 NOTE — Addendum Note (Signed)
 Addended by: LENNIE RONCO T on: 03/22/2024 10:33 AM   Modules accepted: Orders

## 2024-03-22 NOTE — Patient Instructions (Signed)
Use the following websites (and others) to help learn more about your contraception options and find the method that is right for you!  - The Centers for Disease Control (CDC) website: https://www.cdc.gov/reproductivehealth/contraception/index.htm  - [facility] website: https://www.[facility]parenthood.org/learn/birth-control  - Beds[employer].org: https://www.bedsider.org/methods  

## 2024-03-22 NOTE — Progress Notes (Signed)
 GYNECOLOGY ANNUAL PREVENTATIVE CARE ENCOUNTER NOTE  Subjective:   Walsie Smeltz is a 23 y.o. G75P2002 female here for a annual gynecologic exam. Current complaints: wants to restart depo.    Has monthly periods, not heavy. Has been on depo before with good effect.   Denies abnormal vaginal bleeding, discharge, pelvic pain, problems with intercourse or other gynecologic concerns. Declines STI screen.   Gynecologic History Patient's last menstrual period was 03/06/2024 (exact date). Contraception: none Last Pap: n/a Last mammogram: n/a Gardisil: thinks she has received  Obstetric History OB History  Gravida Para Term Preterm AB Living  2 2 2   2   SAB IAB Ectopic Multiple Live Births     0 2    # Outcome Date GA Lbr Len/2nd Weight Sex Type Anes PTL Lv  2 Term 07/22/22 [redacted]w[redacted]d / 00:07 6 lb 1.7 oz (2.77 kg) M Vag-Spont EPI  LIV  1 Term 04/19/21 [redacted]w[redacted]d 01:50 / 00:08 5 lb 14.9 oz (2.69 kg) M Vag-Spont EPI  LIV    Past Medical History:  Diagnosis Date   Elevated blood pressure affecting pregnancy, antepartum 07/03/2022   10/26   Gestational hypertension    Medical history non-contributory    Spontaneous vaginal delivery 07/22/2022   Supervision of high risk pregnancy, antepartum 01/22/2022          Nursing Staff  Provider  Office Location   [facility]  Dating   7wk US   [facility] Model  [x]  Traditional  [ ]  Centering  [ ]  Mom-Baby Dyad        Language   English   Anatomy US    normal  Flu Vaccine   declined  Genetic/Carrier Screen   NIPS:   LR female  AFP:   normal  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 (Patient not taking: Reported on 03/22/2024) 30 tablet 0   No current facility-administered medications on file prior to visit.    No Known Allergies  Social History   Socioeconomic History   Marital status: Single    Spouse name: Not on file   Number of children: Not on file   Years of education: Not on file   Highest education level: Not on file  Occupational History   Not on file  Tobacco Use   Smoking status: Former    Types: Cigarettes   Smokeless tobacco: Former  Advertising account planner   Vaping status: Former   Substances: Flavoring  Substance and Sexual Activity   Alcohol use: Never   Drug use: Never   Sexual activity: Not Currently  Other Topics Concern   Not on file  Social History Narrative   Not on file   Social Drivers of Health   Financial Resource Strain: Not on file  Food Insecurity: Food Insecurity Present (06/02/2022)   Hunger Vital Sign    Worried About Running Out of Food in the Last Year: Sometimes true    Ran Out of Food in the  Last Year: Sometimes true  Transportation Needs: No Transportation Needs (06/02/2022)   PRAPARE - Administrator, Civil Service (Medical): No    Lack of Transportation (Non-Medical): No  Physical Activity: Not on file  Stress: Not on file  Social Connections: Not on file  Intimate Partner Violence: Not on file    Family History  Problem Relation Age of Onset   Healthy Mother    Healthy Father      The following portions of the patient's history were reviewed and updated as appropriate: allergies, current medications, past family history, past medical history, past social history, past surgical history and problem list.  Review of Systems Pertinent items are noted in HPI.   Objective:  BP 125/77   Pulse 67   Wt 127 lb 8 oz (57.8 kg)   LMP 03/06/2024 (Exact Date)   BMI 21.89 kg/m  CONSTITUTIONAL: Well-developed, well-nourished female in no acute distress.  HENT:  Normocephalic,  atraumatic, External right and left ear normal. Oropharynx is clear and moist EYES: Conjunctivae and EOM are normal. Pupils are equal, round, and reactive to light. No scleral icterus.  NECK: Normal range of motion, supple, no masses.  Normal thyroid.  SKIN: Skin is warm and dry. No rash noted. Not diaphoretic. No erythema. No pallor. NEUROLOGIC: Alert and oriented to person, place, and time. Normal reflexes, muscle tone coordination. No cranial nerve deficit noted. PSYCHIATRIC: Normal mood and affect. Normal behavior. Normal judgment and thought content. CARDIOVASCULAR: Normal heart rate noted RESPIRATORY: Effort normal, no problems with respiration noted. BREASTS: deferred ABDOMEN: Soft, no distention noted.  No tenderness, rebound or guarding.  PELVIC: deferred MUSCULOSKELETAL: Normal range of motion. No tenderness.  No cyanosis, clubbing, or edema.  nv  Assessment and Plan:  1. Well woman exam (Primary) Deferred exam  2. Encounter for counseling regarding contraception Reviewed risks/benefits of depo, she has been on it before and very happy with  Wants to restart depo  3. Routine screening for STI (sexually transmitted infection) Reports HIV, Hepatitis B & C, RPR done at [facility] Dept GC/CT today   Will follow up results of STI screen and manage accordingly. Encouraged improvement in diet and exercise.  Accepts STI screen. Pt wil check on gardisil   Routine preventative health maintenance measures emphasized. Please refer to After Visit Summary for other counseling recommendations.    LOIS Yolanda Moats, MD, [facility] Attending Center for [employer] [facility])

## 2024-03-23 LAB — CERVICOVAGINAL ANCILLARY ONLY
Chlamydia: NEGATIVE
Comment: NEGATIVE
Comment: NEGATIVE
Comment: NORMAL
Neisseria Gonorrhea: NEGATIVE
Trichomonas: NEGATIVE
